# Patient Record
Sex: Female | Born: 1985 | Race: White | Hispanic: No | Marital: Married | Smoking: Former smoker
Health system: Southern US, Community
[De-identification: ages and names within clinical notes are randomized; demographics above are authoritative.]

## PROBLEM LIST (undated history)

## (undated) DIAGNOSIS — G500 Trigeminal neuralgia: Secondary | ICD-10-CM

## (undated) DIAGNOSIS — M26629 Arthralgia of temporomandibular joint, unspecified side: Secondary | ICD-10-CM

## (undated) DIAGNOSIS — F411 Generalized anxiety disorder: Secondary | ICD-10-CM

## (undated) HISTORY — DX: Generalized anxiety disorder: F41.1

## (undated) HISTORY — DX: Arthralgia of temporomandibular joint, unspecified side: M26.629

## (undated) HISTORY — DX: Trigeminal neuralgia: G50.0

---

## 2012-01-05 ENCOUNTER — Other Ambulatory Visit: Payer: Self-pay | Admitting: Physician Assistant

## 2012-01-16 ENCOUNTER — Other Ambulatory Visit: Payer: Self-pay | Admitting: Physician Assistant

## 2012-01-16 LAB — CBC WITH DIFFERENTIAL/PLATELET
Basophil #: 0.1 10*3/uL (ref 0.0–0.1)
Basophil %: 0.6 %
HCT: 42.4 % (ref 35.0–47.0)
MCH: 30.9 pg (ref 26.0–34.0)
MCHC: 34.1 g/dL (ref 32.0–36.0)
Monocyte #: 0.4 x10 3/mm (ref 0.2–0.9)
Platelet: 197 10*3/uL (ref 150–440)
RBC: 4.66 10*6/uL (ref 3.80–5.20)
RDW: 12.5 % (ref 11.5–14.5)

## 2012-01-16 LAB — MONONUCLEOSIS SCREEN: Mono Test: NEGATIVE

## 2012-01-18 LAB — THROAT CULTURE

## 2013-05-20 ENCOUNTER — Emergency Department: Payer: Self-pay | Admitting: Emergency Medicine

## 2014-08-31 ENCOUNTER — Inpatient Hospital Stay: Payer: Self-pay

## 2014-08-31 LAB — CBC WITH DIFFERENTIAL/PLATELET
BASOS PCT: 0.7 %
BASOS PCT: 0.4 %
Basophil #: 0.1 10*3/uL (ref 0.0–0.1)
Basophil #: 0.1 10*3/uL (ref 0.0–0.1)
EOS PCT: 0.7 %
EOS PCT: 0.1 %
Eosinophil #: 0.1 10*3/uL (ref 0.0–0.7)
Eosinophil #: 0 10*3/uL (ref 0.0–0.7)
HCT: 40.3 % (ref 35.0–47.0)
HCT: 30.3 % — AB (ref 35.0–47.0)
HGB: 13.3 g/dL (ref 12.0–16.0)
HGB: 10.2 g/dL — AB (ref 12.0–16.0)
LYMPHS PCT: 20 %
LYMPHS PCT: 11.8 %
Lymphocyte #: 2.4 10*3/uL (ref 1.0–3.6)
Lymphocyte #: 1.9 10*3/uL (ref 1.0–3.6)
MCH: 30.2 pg (ref 26.0–34.0)
MCH: 30.9 pg (ref 26.0–34.0)
MCHC: 33.1 g/dL (ref 32.0–36.0)
MCHC: 33.8 g/dL (ref 32.0–36.0)
MCV: 91 fL (ref 80–100)
MCV: 91 fL (ref 80–100)
MONOS PCT: 5.6 %
MONOS PCT: 6.1 %
Monocyte #: 0.7 x10 3/mm (ref 0.2–0.9)
Monocyte #: 1 x10 3/mm — ABNORMAL HIGH (ref 0.2–0.9)
NEUTROS ABS: 8.8 10*3/uL — AB (ref 1.4–6.5)
NEUTROS ABS: 13.4 10*3/uL — AB (ref 1.4–6.5)
Neutrophil %: 73 %
Neutrophil %: 81.6 %
PLATELETS: 198 10*3/uL (ref 150–440)
Platelet: 183 10*3/uL (ref 150–440)
RBC: 4.42 10*6/uL (ref 3.80–5.20)
RBC: 3.32 10*6/uL — AB (ref 3.80–5.20)
RDW: 14.1 % (ref 11.5–14.5)
RDW: 14.4 % (ref 11.5–14.5)
WBC: 12.1 10*3/uL — ABNORMAL HIGH (ref 3.6–11.0)
WBC: 16.4 10*3/uL — AB (ref 3.6–11.0)

## 2014-09-01 LAB — HEMATOCRIT: HCT: 29.4 % — AB (ref 35.0–47.0)

## 2014-11-19 ENCOUNTER — Encounter: Payer: Self-pay | Admitting: *Deleted

## 2014-12-23 NOTE — H&P (Signed)
L&D Evaluation:  History:  HPI -CC: worsening UCs -HPI: 29 y/o G3P2002 @ 38/2 with the above CC. preg uncomplicated. No LOF or decreased FM.  Was 4cm in the office on 1/14   Medications none   Allergies NKDA   Exam:  Vital Signs af vs normal and stable   General no apparent distress   Abdomen gravid, non-tender   Pelvic c/BBOW/cephalic, [id number]   Mebranes Intact   FHT category I   Ucx regular, q64m   Impression:  Impression active labor   Plan:  Comments patient arom'ed for clear fluid and subsequent delivered. see delivery ntoe gbs neg.   Electronic Signatures: Aletha Halim (MD)  (Signed 17-Jan-16 10:12)  Authored: L&D Evaluation   Last Updated: 17-Jan-16 10:12 by Aletha Halim (MD)

## 2017-01-31 DIAGNOSIS — R079 Chest pain, unspecified: Secondary | ICD-10-CM | POA: Diagnosis not present

## 2017-01-31 DIAGNOSIS — R002 Palpitations: Secondary | ICD-10-CM | POA: Diagnosis not present

## 2017-08-29 DIAGNOSIS — Z0000 Encounter for general adult medical examination without abnormal findings: Secondary | ICD-10-CM | POA: Diagnosis not present

## 2018-04-19 DIAGNOSIS — D2261 Melanocytic nevi of right upper limb, including shoulder: Secondary | ICD-10-CM | POA: Diagnosis not present

## 2018-04-19 DIAGNOSIS — D225 Melanocytic nevi of trunk: Secondary | ICD-10-CM | POA: Diagnosis not present

## 2018-04-19 DIAGNOSIS — D2262 Melanocytic nevi of left upper limb, including shoulder: Secondary | ICD-10-CM | POA: Diagnosis not present

## 2018-07-02 DIAGNOSIS — Z23 Encounter for immunization: Secondary | ICD-10-CM | POA: Diagnosis not present

## 2018-09-02 DIAGNOSIS — J029 Acute pharyngitis, unspecified: Secondary | ICD-10-CM | POA: Diagnosis not present

## 2018-09-28 ENCOUNTER — Ambulatory Visit: Payer: 59 | Admitting: Adult Health

## 2018-09-28 ENCOUNTER — Encounter: Payer: Self-pay | Admitting: Adult Health

## 2018-09-28 VITALS — BP 130/82 | HR 74 | Resp 16 | Ht 61.0 in | Wt 153.0 lb

## 2018-09-28 DIAGNOSIS — F4329 Adjustment disorder with other symptoms: Secondary | ICD-10-CM

## 2018-09-28 DIAGNOSIS — F411 Generalized anxiety disorder: Secondary | ICD-10-CM | POA: Diagnosis not present

## 2018-09-28 MED ORDER — ESCITALOPRAM OXALATE 20 MG PO TABS: 20.0000 mg | ORAL_TABLET | Freq: Every day | ORAL | 1 refills | Status: DC

## 2018-09-28 MED ORDER — ALPRAZOLAM 0.25 MG PO TABS: ORAL_TABLET | ORAL | 0 refills | Status: DC

## 2018-09-28 NOTE — Patient Instructions (Signed)
Living With Anxiety    After being diagnosed with an anxiety disorder, you may be relieved to know why you have felt or behaved a certain way. It is natural to also feel overwhelmed about the treatment ahead and what it will mean for your life. With care and support, you can manage this condition and recover from it.  How to cope with anxiety  Dealing with stress  Stress is your body’s reaction to life changes and events, both good and bad. Stress can last just a few hours or it can be ongoing. Stress can play a major role in anxiety, so it is important to learn both how to cope with stress and how to think about it differently.  Talk with your health care provider or a counselor to learn more about stress reduction. He or she may suggest some stress reduction techniques, such as:  · Music therapy. This can include creating or listening to music that you enjoy and that inspires you.  · Mindfulness-based meditation. This involves being aware of your normal breaths, rather than trying to control your breathing. It can be done while sitting or walking.  · Centering prayer. This is a kind of meditation that involves focusing on a word, phrase, or sacred image that is meaningful to you and that brings you peace.  · Deep breathing. To do this, expand your stomach and inhale slowly through your nose. Hold your breath for 3-5 seconds. Then exhale slowly, allowing your stomach muscles to relax.  · Self-talk. This is a skill where you identify thought patterns that lead to anxiety reactions and correct those thoughts.  · Muscle relaxation. This involves tensing muscles then relaxing them.  Choose a stress reduction technique that fits your lifestyle and personality. Stress reduction techniques take time and practice. Set aside 5-15 minutes a day to do them. Therapists can offer training in these techniques. The training may be covered by some insurance plans. Other things you can do to manage stress include:  · Keeping a  stress diary. This can help you learn what triggers your stress and ways to control your response.  · Thinking about how you respond to certain situations. You may not be able to control everything, but you can control your reaction.  · Making time for activities that help you relax, and not feeling guilty about spending your time in this way.  Therapy combined with coping and stress-reduction skills provides the best chance for successful treatment.  Medicines  Medicines can help ease symptoms. Medicines for anxiety include:  · Anti-anxiety drugs.  · Antidepressants.  · Beta-blockers.  Medicines may be used as the main treatment for anxiety disorder, along with therapy, or if other treatments are not working. Medicines should be prescribed by a health care provider.  Relationships  Relationships can play a big part in helping you recover. Try to spend more time connecting with trusted friends and family members. Consider going to couples counseling, taking family education classes, or going to family therapy. Therapy can help you and others better understand the condition.  How to recognize changes in your condition  Everyone has a different response to treatment for anxiety. Recovery from anxiety happens when symptoms decrease and stop interfering with your daily activities at home or work. This may mean that you will start to:  · Have better concentration and focus.  · Sleep better.  · Be less irritable.  · Have more energy.  · Have improved memory.  It is   important to recognize when your condition is getting worse. Contact your health care provider if your symptoms interfere with home or work and you do not feel like your condition is improving.  Where to find help and support:  You can get help and support from these sources:  · Self-help groups.  · Online and community organizations.  · A trusted spiritual leader.  · Couples counseling.  · Family education classes.  · Family therapy.  Follow these instructions  at home:  · Eat a healthy diet that includes plenty of vegetables, fruits, whole grains, low-fat dairy products, and lean protein. Do not eat a lot of foods that are high in solid fats, added sugars, or salt.  · Exercise. Most adults should do the following:  ? Exercise for at least 150 minutes each week. The exercise should increase your heart rate and make you sweat (moderate-intensity exercise).  ? Strengthening exercises at least twice a week.  · Cut down on caffeine, tobacco, alcohol, and other potentially harmful substances.  · Get the right amount and quality of sleep. Most adults need 7-9 hours of sleep each night.  · Make choices that simplify your life.  · Take over-the-counter and prescription medicines only as told by your health care provider.  · Avoid caffeine, alcohol, and certain over-the-counter cold medicines. These may make you feel worse. Ask your pharmacist which medicines to avoid.  · Keep all follow-up visits as told by your health care provider. This is important.  Questions to ask your health care provider  · Would I benefit from therapy?  · How often should I follow up with a health care provider?  · How long do I need to take medicine?  · Are there any long-term side effects of my medicine?  · Are there any alternatives to taking medicine?  Contact a health care provider if:  · You have a hard time staying focused or finishing daily tasks.  · You spend many hours a day feeling worried about everyday life.  · You become exhausted by worry.  · You start to have headaches, feel tense, or have nausea.  · You urinate more than normal.  · You have diarrhea.  Get help right away if:  · You have a racing heart and shortness of breath.  · You have thoughts of hurting yourself or others.  If you ever feel like you may hurt yourself or others, or have thoughts about taking your own life, get help right away. You can go to your nearest emergency department or call:  · Your local emergency services  (911 in the [place]  · A suicide crisis helpline, such as the National Suicide Prevention Lifeline at 1-800-273-8255. This is open 24-hours a day.  Summary  · Taking steps to deal with stress can help calm you.  · Medicines cannot cure anxiety disorders, but they can help ease symptoms.  · Family, friends, and partners can play a big part in helping you recover from an anxiety disorder.  This information is not intended to replace advice given to you by your health care provider. Make sure you discuss any questions you have with your health care provider.  Document Released: 07/26/2016 Document Revised: 07/26/2016 Document Reviewed: 07/26/2016  Elsevier Interactive Patient Education © 2019 [employer]vier Inc.

## 2018-09-28 NOTE — Progress Notes (Signed)
[facility] [place],  [address]  Internal MEDICINE  Office Visit Note  Patient Name: Jamie Kennedy  [id number]  [id number]  Date of Service: 12/17/2018   Complaints/HPI Pt is here for establishment of PCP. Chief Complaint  Patient presents with  . New Patient (Initial Visit)    also feels something stuck on throat   . Shortness of Breath  . Anxiety   HPI Patient is here to establish care with our practice.  She is moving from [facility].  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  She has 3 kids at home and lives with her husband who also works full-time.  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  She reports that she intermittently smokes maybe 5 cigarettes a day.  She does use a vape also but states that it is not a regular thing.  She has alcohol approximately 3 times a month and denies any illicit drug use.  She currently taking no prescription medications.  She uses Mirena IUD for her birth control that has been in place since 2016.  She mostly complaining of a feeling of something stuck in her throat that she contributes to anxiety and stressful situations.  She describes it as a lump in her throat.  She tried taking Prilosec daily for 2 weeks with no change.  She describe one incident as Working on school work, was Estate agent a paper, was expecting her kid home soon, and she felt super anxious.  She went for walk which makes it better.  Used some essential oils, and started a meditation CD.  She broke out in a cold sweat, shaking, and felt extreme sob. Symptoms are worsening, and happening daily.   Current Medication: Outpatient Encounter Medications as of 09/28/2018  Medication Sig  . escitalopram (LEXAPRO) 20 MG tablet Take 1 tablet (20 mg total) by mouth daily. (Patient not taking: Reported on 12/10/2018)  . [DISCONTINUED] ALPRAZolam (XANAX) 0.25 MG tablet Half to one tab tab as needed for panic attacks   No  facility-administered encounter medications on file as of 09/28/2018.     Surgical History: No surgical history  Medical History: History reviewed. No pertinent past medical history.  Family History: Family History  Problem Relation Age of Onset  . Arthritis/Rheumatoid Mother   . Anxiety disorder Brother   . Parkinson's disease Maternal Grandfather   . Dementia Maternal Grandfather     Social History   Socioeconomic History  . Marital status: Married    Spouse name: Not on file  . Number of children: Not on file  . Years of education: Not on file  . Highest education level: Not on file  Occupational History  . Not on file  Social Needs  . Financial resource strain: Not on file  . Food insecurity:    Worry: Not on file    Inability: Not on file  . Transportation needs:    Medical: Not on file    Non-medical: Not on file  Tobacco Use  . Smoking status: Current Some Day Smoker    Types: Cigarettes  . Smokeless tobacco: Never Used  Substance and Sexual Activity  . Alcohol use: Never    Frequency: Never  . Drug use: Never  . Sexual activity: Not on file  Lifestyle  . Physical activity:    Days per week: Not on file    Minutes per session: Not on file  . Stress: Not on file  Relationships  .  Social connections:    Talks on phone: Not on file    Gets together: Not on file    Attends religious service: Not on file    Active member of club or organization: Not on file    Attends meetings of clubs or organizations: Not on file    Relationship status: Not on file  . Intimate partner violence:    Fear of current or ex partner: Not on file    Emotionally abused: Not on file    Physically abused: Not on file    Forced sexual activity: Not on file  Other Topics Concern  . Not on file  Social History Narrative  . Not on file     Review of Systems  Constitutional: Negative for chills, fatigue and unexpected weight change.  HENT: Negative for congestion,  rhinorrhea, sneezing and sore throat.   Eyes: Negative for photophobia, pain and redness.  Respiratory: Negative for cough, chest tightness and shortness of breath.   Cardiovascular: Negative for chest pain and palpitations.  Gastrointestinal: Negative for abdominal pain, constipation, diarrhea, nausea and vomiting.  Endocrine: Negative.   Genitourinary: Negative for dysuria and frequency.  Musculoskeletal: Negative for arthralgias, back pain, joint swelling and neck pain.  Skin: Negative for rash.  Allergic/Immunologic: Negative.   Neurological: Negative for tremors and numbness.  Hematological: Negative for adenopathy. Does not bruise/bleed easily.  Psychiatric/Behavioral: Negative for behavioral problems and sleep disturbance. The patient is not nervous/anxious.     Vital Signs: BP 130/82   Pulse 74   Resp 16   Ht 5\' 1"  (1.549 m)   Wt 153 lb (69.4 kg)   SpO2 98%   BMI 28.91 kg/m    Physical Exam Vitals signs and nursing note reviewed.  Constitutional:      General: She is not in acute distress.    Appearance: She is well-developed. She is not diaphoretic.  HENT:     Head: Normocephalic and atraumatic.     Mouth/Throat:     Pharynx: No oropharyngeal exudate.  Eyes:     Pupils: Pupils are equal, round, and reactive to light.  Neck:     Musculoskeletal: Normal range of motion and neck supple.     Thyroid: No thyromegaly.     Vascular: No JVD.     Trachea: No tracheal deviation.  Cardiovascular:     Rate and Rhythm: Normal rate and regular rhythm.     Heart sounds: Normal heart sounds. No murmur. No friction rub. No gallop.   Pulmonary:     Effort: Pulmonary effort is normal. No respiratory distress.     Breath sounds: Normal breath sounds. No wheezing or rales.  Chest:     Chest wall: No tenderness.  Abdominal:     Palpations: Abdomen is soft.     Tenderness: There is no abdominal tenderness. There is no guarding.  Musculoskeletal: Normal range of motion.   Lymphadenopathy:     Cervical: No cervical adenopathy.  Skin:    General: Skin is warm and dry.  Neurological:     Mental Status: She is alert and oriented to person, place, and time.     Cranial Nerves: No cranial nerve deficit.  Psychiatric:        Behavior: Behavior normal.        Thought Content: Thought content normal.        Judgment: Judgment normal.    Assessment/Plan: 1. GAD (generalized anxiety disorder) Pts symptoms likely due to the culmination of her life  currently.  Her stress and anxiety are high.  Will presscribe some xanax for as needed and give her an RX for lexapro to start if she starts to need the xanax often.  She feels like she can control it most of the time, however, feels that the xanax could be helpful at times.  - escitalopram (LEXAPRO) 20 MG tablet; Take 1 tablet (20 mg total) by mouth daily. (Patient not taking: Reported on 12/10/2018)  Dispense: 30 tablet; Refill: 1  2. Stress and adjustment reaction Discussed ways to continue to modify stressors. Continue to use meditation, and exercise as discussed.   General Counseling: Sherral verbalizes understanding of the findings of todays visit and agrees with plan of treatment. I have discussed any further diagnostic evaluation that may be needed or ordered today. We also reviewed her medications today. she has been encouraged to call the office with any questions or concerns that should arise related to todays visit.  No orders of the defined types were placed in this encounter.   Meds ordered this encounter  Medications  . escitalopram (LEXAPRO) 20 MG tablet    Sig: Take 1 tablet (20 mg total) by mouth daily.    Dispense:  30 tablet    Refill:  1  . DISCONTD: ALPRAZolam (XANAX) 0.25 MG tablet    Sig: Half to one tab tab as needed for panic attacks    Dispense:  10 tablet    Refill:  0    Time spent: 25 Minutes   This patient was seen by Orson Gear AGNP-C in Collaboration with Dr Lavera Guise as a  part of collaborative care agreement  Kendell Bane AGNP-C Internal Medicine

## 2018-10-01 ENCOUNTER — Encounter: Payer: Self-pay | Admitting: Adult Health

## 2018-10-01 ENCOUNTER — Ambulatory Visit: Payer: 59 | Admitting: Adult Health

## 2018-10-01 VITALS — BP 118/82 | HR 79 | Temp 98.6°F | Resp 16 | Ht 61.0 in | Wt 153.0 lb

## 2018-10-01 DIAGNOSIS — J0110 Acute frontal sinusitis, unspecified: Secondary | ICD-10-CM | POA: Diagnosis not present

## 2018-10-01 DIAGNOSIS — J029 Acute pharyngitis, unspecified: Secondary | ICD-10-CM | POA: Diagnosis not present

## 2018-10-01 DIAGNOSIS — R011 Cardiac murmur, unspecified: Secondary | ICD-10-CM

## 2018-10-01 LAB — POCT RAPID STREP A (OFFICE): Rapid Strep A Screen: NEGATIVE

## 2018-10-01 MED ORDER — AMOXICILLIN-POT CLAVULANATE 875-125 MG PO TABS: 1.0000 | ORAL_TABLET | Freq: Two times a day (BID) | ORAL | 0 refills | Status: DC

## 2018-10-01 NOTE — Patient Instructions (Signed)

## 2018-10-01 NOTE — Progress Notes (Signed)
[facility] [place], [place] [address]  Internal MEDICINE  Office Visit Note  Patient Name: Jamie Kennedy  [id number]  [id number]  Date of Service: 10/03/2018  Chief Complaint  Patient presents with  . Sore Throat    three other members of family had strep   . Ear Pain    both      HPI Pt is here for a sick visit. Pt reports sore throat, ear fullness and PND.  She reports facial pain and tenderness but denies any fever.  However, she does report that she has felt chills over the last few days.  She is concerned that she may have the beginnings of strep throat as the other members living in her household have been diagnosed with strep.  She is also reporting some ear fullness that is intermittent.     Current Medication:  Outpatient Encounter Medications as of 10/01/2018  Medication Sig  . ALPRAZolam (XANAX) 0.25 MG tablet Half to one tab tab as needed for panic attacks  . escitalopram (LEXAPRO) 20 MG tablet Take 1 tablet (20 mg total) by mouth daily.  Marland Kitchen amoxicillin-clavulanate (AUGMENTIN) 875-125 MG tablet Take 1 tablet by mouth 2 (two) times daily.   No facility-administered encounter medications on file as of 10/01/2018.       Medical History: History reviewed. No pertinent past medical history.   Vital Signs: BP 118/82   Pulse 79   Temp 98.6 F (37 C)   Resp 16   Ht 5\' 1"  (1.549 m)   Wt 153 lb (69.4 kg)   SpO2 99%   BMI 28.91 kg/m    Review of Systems  Constitutional: Negative for chills, fatigue and unexpected weight change.  HENT: Positive for ear pain, postnasal drip, rhinorrhea, sinus pressure and sinus pain. Negative for congestion, sneezing and sore throat.   Eyes: Negative for photophobia, pain and redness.  Respiratory: Negative for cough, chest tightness and shortness of breath.   Cardiovascular: Negative for chest pain and palpitations.  Gastrointestinal: Negative for abdominal pain, constipation, diarrhea, nausea and  vomiting.  Endocrine: Negative.   Genitourinary: Negative for dysuria and frequency.  Musculoskeletal: Negative for arthralgias, back pain, joint swelling and neck pain.  Skin: Negative for rash.  Allergic/Immunologic: Negative.   Neurological: Negative for tremors and numbness.  Hematological: Negative for adenopathy. Does not bruise/bleed easily.  Psychiatric/Behavioral: Negative for behavioral problems and sleep disturbance. The patient is not nervous/anxious.     Physical Exam Vitals signs and nursing note reviewed.  Constitutional:      General: She is not in acute distress.    Appearance: She is well-developed. She is not diaphoretic.  HENT:     Head: Normocephalic and atraumatic.     Mouth/Throat:     Pharynx: No oropharyngeal exudate.  Eyes:     Pupils: Pupils are equal, round, and reactive to light.  Neck:     Musculoskeletal: Normal range of motion and neck supple.     Thyroid: No thyromegaly.     Vascular: No JVD.     Trachea: No tracheal deviation.  Cardiovascular:     Rate and Rhythm: Normal rate and regular rhythm.     Heart sounds: Murmur present. No friction rub. No gallop.   Pulmonary:     Effort: Pulmonary effort is normal. No respiratory distress.     Breath sounds: Normal breath sounds. No wheezing or rales.  Chest:     Chest wall: No tenderness.  Abdominal:  Palpations: Abdomen is soft.     Tenderness: There is no abdominal tenderness. There is no guarding.  Musculoskeletal: Normal range of motion.  Lymphadenopathy:     Cervical: No cervical adenopathy.  Skin:    General: Skin is warm and dry.  Neurological:     Mental Status: She is alert and oriented to person, place, and time.     Cranial Nerves: No cranial nerve deficit.  Psychiatric:        Behavior: Behavior normal.        Thought Content: Thought content normal.        Judgment: Judgment normal.    Assessment/Plan: 1. Acute non-recurrent frontal sinusitis Patient provided with  course of Augmentin.  Instructed patient to take all medication until completed.  Patient may return to clinic in 7 to 10 days if symptoms fail to improve.  I encouraged her to continue take over-the-counter medications to treat her symptoms and to drink plenty of fluids and rest as much as possible. - amoxicillin-clavulanate (AUGMENTIN) 875-125 MG tablet; Take 1 tablet by mouth 2 (two) times daily.  Dispense: 20 tablet; Refill: 0  2. Sore throat Strep negative today in the office. - POCT rapid strep A  3. Murmur Heart murmur noted on exam will get echo to full evaluate. - ECHOCARDIOGRAM COMPLETE; Future  General Counseling: Jamie Kennedy verbalizes understanding of the findings of todays visit and agrees with plan of treatment. I have discussed any further diagnostic evaluation that may be needed or ordered today. We also reviewed her medications today. she has been encouraged to call the office with any questions or concerns that should arise related to todays visit.   Orders Placed This Encounter  Procedures  . POCT rapid strep A  . ECHOCARDIOGRAM COMPLETE    Meds ordered this encounter  Medications  . amoxicillin-clavulanate (AUGMENTIN) 875-125 MG tablet    Sig: Take 1 tablet by mouth 2 (two) times daily.    Dispense:  20 tablet    Refill:  0    Time spent: 25 Minutes  This patient was seen by Orson Gear AGNP-C in Collaboration with Dr Lavera Guise as a part of collaborative care agreement.  Kendell Bane AGNP-C Internal Medicine

## 2018-10-05 ENCOUNTER — Other Ambulatory Visit: Payer: Self-pay

## 2018-10-12 ENCOUNTER — Ambulatory Visit: Payer: 59

## 2018-10-12 DIAGNOSIS — R011 Cardiac murmur, unspecified: Secondary | ICD-10-CM

## 2018-10-16 ENCOUNTER — Encounter: Payer: Self-pay | Admitting: Adult Health

## 2018-11-13 ENCOUNTER — Other Ambulatory Visit: Payer: Self-pay | Admitting: Adult Health

## 2018-12-10 ENCOUNTER — Other Ambulatory Visit: Payer: Self-pay | Admitting: Adult Health

## 2018-12-10 ENCOUNTER — Other Ambulatory Visit: Payer: Self-pay

## 2018-12-10 ENCOUNTER — Encounter: Payer: Self-pay | Admitting: Adult Health

## 2018-12-10 ENCOUNTER — Ambulatory Visit (INDEPENDENT_AMBULATORY_CARE_PROVIDER_SITE_OTHER): Payer: 59 | Admitting: Adult Health

## 2018-12-10 VITALS — Resp 16 | Ht 61.0 in | Wt 152.0 lb

## 2018-12-10 DIAGNOSIS — R011 Cardiac murmur, unspecified: Secondary | ICD-10-CM

## 2018-12-10 DIAGNOSIS — F411 Generalized anxiety disorder: Secondary | ICD-10-CM | POA: Diagnosis not present

## 2018-12-10 MED ORDER — ALPRAZOLAM 0.25 MG PO TABS: ORAL_TABLET | ORAL | 0 refills | Status: DC

## 2018-12-10 NOTE — Patient Instructions (Signed)
Alprazolam tablets What is this medicine? ALPRAZOLAM (al PRAY zoe lam) is a benzodiazepine. It is used to treat anxiety and panic attacks. This medicine may be used for other purposes; ask your health care provider or pharmacist if you have questions. COMMON BRAND NAME(S): Xanax What should I tell my health care provider before I take this medicine? They need to know if you have any of these conditions: -an alcohol or drug abuse problem -bipolar disorder, depression, psychosis or other mental health conditions -glaucoma -kidney or liver disease -lung or breathing disease -myasthenia gravis -Parkinson's disease -porphyria -seizures or a history of seizures -suicidal thoughts -an unusual or allergic reaction to alprazolam, other benzodiazepines, foods, dyes, or preservatives -pregnant or trying to get pregnant -breast-feeding How should I use this medicine? Take this medicine by mouth with a glass of water. Follow the directions on the prescription label. Take your medicine at regular intervals. Do not take it more often than directed. Do not stop taking except on your doctor's advice. A special MedGuide will be given to you by the pharmacist with each prescription and refill. Be sure to read this information carefully each time. Talk to your pediatrician regarding the use of this medicine in children. Special care may be needed. Overdosage: If you think you have taken too much of this medicine contact a poison control center or emergency room at once. NOTE: This medicine is only for you. Do not share this medicine with others. What if I miss a dose? If you miss a dose, take it as soon as you can. If it is almost time for your next dose, take only that dose. Do not take double or extra doses. What may interact with this medicine? Do not take this medicine with any of the following medications: -certain antiviral medicines for HIV or AIDS like delavirdine, indinavir -certain medicines for  fungal infections like ketoconazole and itraconazole -narcotic medicines for cough -sodium oxybate This medicine may also interact with the following medications: -alcohol -antihistamines for allergy, cough and cold -certain antibiotics like clarithromycin, erythromycin, isoniazid, rifampin, rifapentine, rifabutin, and troleandomycin -certain medicines for blood pressure, heart disease, irregular heart beat -certain medicines for depression, like amitriptyline, fluoxetine, sertraline -certain medicines for seizures like carbamazepine, oxcarbazepine, phenobarbital, phenytoin, primidone -cimetidine -cyclosporine -female hormones, like estrogens or progestins and birth control pills, patches, rings, or injections -general anesthetics like halothane, isoflurane, methoxyflurane, propofol -grapefruit juice -local anesthetics like lidocaine, pramoxine, tetracaine -medicines that relax muscles for surgery -narcotic medicines for pain -other antiviral medicines for HIV or AIDS -phenothiazines like chlorpromazine, mesoridazine, prochlorperazine, thioridazine This list may not describe all possible interactions. Give your health care provider a list of all the medicines, herbs, non-prescription drugs, or dietary supplements you use. Also tell them if you smoke, drink alcohol, or use illegal drugs. Some items may interact with your medicine. What should I watch for while using this medicine? Tell your doctor or health care professional if your symptoms do not start to get better or if they get worse. Do not stop taking except on your doctor's advice. You may develop a severe reaction. Your doctor will tell you how much medicine to take. You may get drowsy or dizzy. Do not drive, use machinery, or do anything that needs mental alertness until you know how this medicine affects you. To reduce the risk of dizzy and fainting spells, do not stand or sit up quickly, especially if you are an older patient.  Alcohol may increase dizziness and drowsiness. Avoid alcoholic  drowsy or dizzy. Do not drive, use machinery, or do anything that needs mental alertness until you know how this medicine affects you. To reduce the risk of dizzy and fainting spells, do not stand or sit up quickly, especially if you are an older patient.  Alcohol may increase dizziness and drowsiness. Avoid alcoholic drinks.  If you are taking another medicine that also causes drowsiness, you may have more side effects. Give your health care provider a list of all medicines you use. Your doctor will tell you how much medicine to take. Do not take more medicine than directed. Call emergency for help if you have problems breathing or unusual sleepiness.  What side effects may I notice from receiving this medicine?  Side effects that you should report to your doctor or health care professional as soon as possible:  -allergic reactions like skin rash, itching or hives, swelling of the face, lips, or tongue  -breathing problems  -confusion  -loss of balance or coordination  -signs and symptoms of low blood pressure like dizziness; feeling faint or lightheaded, falls; unusually weak or tired  -suicidal thoughts or other mood changes  Side effects that usually do not require medical attention (report to your doctor or health care professional if they continue or are bothersome):  -dizziness  -dry mouth  -nausea, vomiting  -tiredness  This list may not describe all possible side effects. Call your doctor for medical advice about side effects. You may report side effects to FDA at 1-800-FDA-1088.  Where should I keep my medicine?  Keep out of the reach of children. This medicine can be abused. Keep your medicine in a safe place to protect it from theft. Do not share this medicine with anyone. Selling or giving away this medicine is dangerous and against the law.  Store at room temperature between 20 and 25 degrees C (68 and 77 degrees F). This medicine may cause accidental overdose and death if taken by other adults, children, or pets. Mix any unused medicine with a substance like cat litter or coffee grounds. Then throw the medicine away in a sealed container like a sealed bag or a coffee can with a lid. Do not use the medicine after the expiration date.  NOTE: This sheet is  a summary. It may not cover all possible information. If you have questions about this medicine, talk to your doctor, pharmacist, or health care provider.  © 2019 Elsevier/Gold Standard (2015-04-30 13:47:25)

## 2018-12-10 NOTE — Progress Notes (Signed)
[facility] [place], [place] [address]  Internal MEDICINE  Telephone Visit  Patient Name: Jamie Kennedy  [id number]  [id number]  Date of Service: 12/10/2018  I connected with the patient at 1010 by telephone and verified the patients identity using two identifiers. I discussed the limitations, risks, security and privacy concerns of performing an evaluation and management service by telephone and the availability of in person appointments. I also discussed with the patient that there may be a patient responsible charge related to the service.  The patient expressed understanding and agrees to proceed.    Chief Complaint  Patient presents with  . Telephone Screen  . Medical Management of Chronic Issues    ECHO REVIEW   . Telephone Assessment    HPI  PT reports feeling better since last visit.  She reports she did not start the lexapro because her symptoms improved greatly.  This is very good considering the recent Covid-19 lockdown and her children being out of school.  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  She has managed well, and has only taken 6 or the 10 Xanax tablets as of today.  She reports good control of symptoms, and she would like a refill to have xanax on hand for future events.  Her Echo results are reviewed with her at this time. Normal LVEF, and trace TR.      Current Medication: Outpatient Encounter Medications as of 12/10/2018  Medication Sig  . ALPRAZolam (XANAX) 0.25 MG tablet Half to one tab tab as needed for panic attacks  . [DISCONTINUED] ALPRAZolam (XANAX) 0.25 MG tablet Half to one tab tab as needed for panic attacks  . escitalopram (LEXAPRO) 20 MG tablet Take 1 tablet (20 mg total) by mouth daily. (Patient not taking: Reported on 12/10/2018)  . [DISCONTINUED] amoxicillin-clavulanate (AUGMENTIN) 875-125 MG tablet Take 1 tablet by mouth 2 (two) times daily.   No  facility-administered encounter medications on file as of 12/10/2018.     Surgical History: No past surgical history on file.  Medical History: No past medical history on file.  Family History: Family History  Problem Relation Age of Onset  . Arthritis/Rheumatoid Mother   . Anxiety disorder Brother   . Parkinson's disease Maternal Grandfather   . Dementia Maternal Grandfather     Social History   Socioeconomic History  . Marital status: Married    Spouse name: Not on file  . Number of children: Not on file  . Years of education: Not on file  . Highest education level: Not on file  Occupational History  . Not on file  Social Needs  . Financial resource strain: Not on file  . Food insecurity:    Worry: Not on file    Inability: Not on file  . Transportation needs:    Medical: Not on file    Non-medical: Not on file  Tobacco Use  . Smoking status: Current Some Day Smoker    Types: Cigarettes  . Smokeless tobacco: Never Used  Substance and Sexual Activity  . Alcohol use: Never    Frequency: Never  . Drug use: Never  . Sexual activity: Not on file  Lifestyle  . Physical activity:    Days per week: Not on file    Minutes per session: Not on file  . Stress: Not on file  Relationships  . Social connections:    Talks on phone: Not  on file    Gets together: Not on file    Attends religious service: Not on file    Active member of club or organization: Not on file    Attends meetings of clubs or organizations: Not on file    Relationship status: Not on file  . Intimate partner violence:    Fear of current or ex partner: Not on file    Emotionally abused: Not on file    Physically abused: Not on file    Forced sexual activity: Not on file  Other Topics Concern  . Not on file  Social History Narrative  . Not on file      Review of Systems  Constitutional: Negative for chills, fatigue and unexpected weight change.  HENT: Negative for congestion, rhinorrhea,  sneezing and sore throat.   Eyes: Negative for photophobia, pain and redness.  Respiratory: Negative for cough, chest tightness and shortness of breath.   Cardiovascular: Negative for chest pain and palpitations.  Gastrointestinal: Negative for abdominal pain, constipation, diarrhea, nausea and vomiting.  Endocrine: Negative.   Genitourinary: Negative for dysuria and frequency.  Musculoskeletal: Negative for arthralgias, back pain, joint swelling and neck pain.  Skin: Negative for rash.  Allergic/Immunologic: Negative.   Neurological: Negative for tremors and numbness.  Hematological: Negative for adenopathy. Does not bruise/bleed easily.  Psychiatric/Behavioral: Negative for behavioral problems and sleep disturbance. The patient is not nervous/anxious.     Vital Signs: Resp 16   Ht 5\' 1"  (1.549 m)   Wt 152 lb (68.9 kg)   BMI 28.72 kg/m    Observation/Objective:  Appears well, speaking in full sentences.    Assessment/Plan: 1. GAD (generalized anxiety disorder) Pt reports taking approximatly 6 tablets of the 10 ordered previously.  She states she has good contorl of her anxiety but feels like she will need to have the xanax on hand for future issues intermittnelty.  Refilled patients RX, and will see her back for physical in one month as scheduled.  Reviewed risks and possible side effects associated with taking opiates, benzodiazepines and other CNS depressants. Combination of these could cause dizziness and drowsiness. Advised patient not to drive or operate machinery when taking these medications, as patient's and other's life can be at risk and will have consequences. Patient verbalized understanding in this matter. Dependence and abuse for these drugs will be monitored closely. A Controlled substance policy and procedure is on file which allows [facility] to order a urine drug screen test at any visit. Patient understands and agrees with the plan - ALPRAZolam (XANAX)  0.25 MG tablet; Half to one tab tab as needed for panic attacks  Dispense: 30 tablet; Refill: 0  2. Murmur Trace Tricuspid regurgitation on echo.  General Counseling: Jamie Kennedy verbalizes understanding of the findings of today's phone visit and agrees with plan of treatment. I have discussed any further diagnostic evaluation that may be needed or ordered today. We also reviewed her medications today. she has been encouraged to call the office with any questions or concerns that should arise related to todays visit.    No orders of the defined types were placed in this encounter.   Meds ordered this encounter  Medications  . ALPRAZolam (XANAX) 0.25 MG tablet    Sig: Half to one tab tab as needed for panic attacks    Dispense:  30 tablet    Refill:  0    Time spent: [place] AGNP-C Internal medicine

## 2019-01-14 ENCOUNTER — Other Ambulatory Visit: Payer: Self-pay

## 2019-01-14 ENCOUNTER — Ambulatory Visit (INDEPENDENT_AMBULATORY_CARE_PROVIDER_SITE_OTHER): Payer: 59 | Admitting: Nurse Practitioner

## 2019-01-14 ENCOUNTER — Encounter: Payer: Self-pay | Admitting: Nurse Practitioner

## 2019-01-14 VITALS — BP 112/70 | HR 76 | Resp 16 | Ht 61.0 in | Wt 155.2 lb

## 2019-01-14 DIAGNOSIS — F411 Generalized anxiety disorder: Secondary | ICD-10-CM

## 2019-01-14 DIAGNOSIS — Z0001 Encounter for general adult medical examination with abnormal findings: Secondary | ICD-10-CM

## 2019-01-14 DIAGNOSIS — Z124 Encounter for screening for malignant neoplasm of cervix: Secondary | ICD-10-CM

## 2019-01-14 DIAGNOSIS — R300 Dysuria: Secondary | ICD-10-CM | POA: Diagnosis not present

## 2019-01-14 LAB — HM PAP SMEAR: HM Pap smear: NEGATIVE

## 2019-01-14 NOTE — Progress Notes (Signed)
[facility] [place], [place] [address]  Internal MEDICINE  Office Visit Note  Patient Name: Jamie Kennedy  [id number]  [id number]  Date of Service: 01/22/2019   Pt is here for routine health maintenance examination   Chief Complaint  Patient presents with  . Annual Exam  . Gynecologic Exam     The patient is here for health maintenance exam. She has no concerns or complaints to discuss. Has irregular periods as she has Mirena IUD. Will be due for removal next year. The patient had been prescribed lexapro and low dose alprazolam back in February. Had been been having panic attacks and palpitations. She found that taking decongestants was causing the problems. Never had to take lexapro. Will, very intermittently, take alprazolam for bad anxiety, especially related to travel.    Current Medication: Outpatient Encounter Medications as of 01/14/2019  Medication Sig  . ALPRAZolam (XANAX) 0.25 MG tablet Half to one tab tab as needed for panic attacks  . [DISCONTINUED] escitalopram (LEXAPRO) 20 MG tablet Take 1 tablet (20 mg total) by mouth daily. (Patient not taking: Reported on 12/10/2018)   No facility-administered encounter medications on file as of 01/14/2019.     Surgical History: History reviewed. No pertinent surgical history.  Medical History: History reviewed. No pertinent past medical history.  Family History: Family History  Problem Relation Age of Onset  . Arthritis/Rheumatoid Mother   . Anxiety disorder Brother   . Parkinson's disease Maternal Grandfather   . Dementia Maternal Grandfather       Review of Systems  Constitutional: Negative for chills, fatigue and unexpected weight change.  HENT: Negative for congestion, rhinorrhea, sneezing and sore throat.   Respiratory: Negative for cough, chest tightness and shortness of breath.   Cardiovascular: Negative for chest pain and palpitations.  Gastrointestinal: Negative for abdominal pain,  constipation, diarrhea, nausea and vomiting.  Endocrine: Negative for cold intolerance, heat intolerance, polydipsia and polyuria.  Genitourinary: Negative for dysuria, frequency and vaginal discharge.  Musculoskeletal: Negative for arthralgias, back pain, joint swelling and neck pain.  Skin: Negative for rash.  Allergic/Immunologic: Negative for environmental allergies.  Neurological: Negative for dizziness, tremors, numbness and headaches.  Hematological: Negative for adenopathy. Does not bruise/bleed easily.  Psychiatric/Behavioral: Negative for behavioral problems and sleep disturbance. The patient is not nervous/anxious.      Today's Vitals   01/14/19 1445  BP: 112/70  Pulse: 76  Resp: 16  SpO2: 100%  Weight: 155 lb 3.2 oz (70.4 kg)  Height: 5\' 1"  (1.549 m)   Body mass index is 29.32 kg/m.   Physical Exam Vitals signs and nursing note reviewed.  Constitutional:      General: She is not in acute distress.    Appearance: Normal appearance. She is well-developed. She is not diaphoretic.  HENT:     Head: Normocephalic and atraumatic.     Mouth/Throat:     Pharynx: No oropharyngeal exudate.  Eyes:     Pupils: Pupils are equal, round, and reactive to light.  Neck:     Musculoskeletal: Normal range of motion and neck supple.     Thyroid: No thyromegaly.     Vascular: No JVD.     Trachea: No tracheal deviation.  Cardiovascular:     Rate and Rhythm: Normal rate and regular rhythm.     Pulses: Normal pulses.     Heart sounds: Normal heart sounds. No friction rub. No gallop.   Pulmonary:     Effort: Pulmonary effort is  normal. No respiratory distress.     Breath sounds: Normal breath sounds. No wheezing or rales.  Chest:     Chest wall: No tenderness.     Breasts:        Right: Normal. No swelling, bleeding, inverted nipple, mass, nipple discharge, skin change or tenderness.        Left: Normal. No swelling, bleeding, inverted nipple, mass, nipple discharge, skin change  or tenderness.  Abdominal:     General: Abdomen is flat. Bowel sounds are normal.     Palpations: Abdomen is soft.     Tenderness: There is no abdominal tenderness. There is no guarding.     Hernia: There is no hernia in the right inguinal area or left inguinal area.  Genitourinary:    General: Normal vulva.     Exam position: Supine.     Labia:        Right: No tenderness or lesion.        Left: No tenderness or lesion.      Vagina: Normal. No vaginal discharge, erythema, tenderness or bleeding.     Cervix: No cervical motion tenderness, discharge or erythema.     Uterus: Normal.      Adnexa: Right adnexa normal and left adnexa normal.     Comments: No tenderness, masses, or organomeglay present during bimanual exam . Musculoskeletal: Normal range of motion.  Lymphadenopathy:     Cervical: No cervical adenopathy.     Lower Body: No right inguinal adenopathy. No left inguinal adenopathy.  Skin:    General: Skin is warm and dry.  Neurological:     Mental Status: She is alert and oriented to person, place, and time.     Cranial Nerves: No cranial nerve deficit.  Psychiatric:        Behavior: Behavior normal.        Thought Content: Thought content normal.        Judgment: Judgment normal.      LABS: Recent Results (from the past 2160 hour(s))  UA/M w/rflx Culture, Routine     Status: None   Collection Time: 01/14/19  2:55 PM  Result Value Ref Range   Specific Gravity, UA 1.005 1.005 - 1.030   pH, UA 7.0 5.0 - 7.5   Color, UA Yellow Yellow   Appearance Ur Clear Clear   Leukocytes,UA Negative Negative   Protein,UA Negative Negative/Trace   Glucose, UA Negative Negative   Ketones, UA Negative Negative   RBC, UA Negative Negative   Bilirubin, UA Negative Negative   Urobilinogen, Ur 0.2 0.2 - 1.0 mg/dL   Nitrite, UA Negative Negative   Microscopic Examination Comment     Comment: Microscopic follows if indicated.   Microscopic Examination See below:     Comment:  Microscopic was indicated and was performed.   Urinalysis Reflex Comment     Comment: This specimen will not reflex to a Urine Culture.  Microscopic Examination     Status: None   Collection Time: 01/14/19  2:55 PM  Result Value Ref Range   WBC, UA 0-5 0 - 5 /hpf   RBC 0-2 0 - 2 /hpf   Epithelial Cells (non renal) 0-10 0 - 10 /hpf   Casts None seen None seen /lpf   Mucus, UA Present Not Estab.   Bacteria, UA None seen None seen/Few  CBC with Differential/Platelet     Status: None   Collection Time: 01/17/19  8:28 AM  Result Value Ref Range   WBC  10.0 3.4 - 10.8 x10E3/uL   RBC 4.54 3.77 - 5.28 x10E6/uL   Hemoglobin 14.1 11.1 - 15.9 g/dL   Hematocrit 40.6 34.0 - 46.6 %   MCV 89 79 - 97 fL   MCH 31.1 26.6 - 33.0 pg   MCHC 34.7 31.5 - 35.7 g/dL   RDW 11.7 11.7 - 15.4 %   Platelets 232 150 - 450 x10E3/uL   Neutrophils 60 Not Estab. %   Lymphs 30 Not Estab. %   Monocytes 7 Not Estab. %   Eos 2 Not Estab. %   Basos 1 Not Estab. %   Neutrophils Absolute 6.0 1.4 - 7.0 x10E3/uL   Lymphocytes Absolute 3.0 0.7 - 3.1 x10E3/uL   Monocytes Absolute 0.7 0.1 - 0.9 x10E3/uL   EOS (ABSOLUTE) 0.2 0.0 - 0.4 x10E3/uL   Basophils Absolute 0.1 0.0 - 0.2 x10E3/uL   Immature Granulocytes 0 Not Estab. %   Immature Grans (Abs) 0.0 0.0 - 0.1 x10E3/uL  Comprehensive metabolic panel     Status: None   Collection Time: 01/17/19  8:28 AM  Result Value Ref Range   Glucose 93 65 - 99 mg/dL   BUN 10 6 - 20 mg/dL   Creatinine, Ser 0.75 0.57 - 1.00 mg/dL   GFR calc non Af Amer 105 >59 mL/min/1.73   GFR calc Af Amer 121 >59 mL/min/1.73   BUN/Creatinine Ratio 13 9 - 23   Sodium 138 134 - 144 mmol/L   Potassium 4.2 3.5 - 5.2 mmol/L   Chloride 104 96 - 106 mmol/L   CO2 20 20 - 29 mmol/L   Calcium 9.0 8.7 - 10.2 mg/dL   Total Protein 6.2 6.0 - 8.5 g/dL   Albumin 4.2 3.8 - 4.8 g/dL   Globulin, Total 2.0 1.5 - 4.5 g/dL   Albumin/Globulin Ratio 2.1 1.2 - 2.2   Bilirubin Total 0.4 0.0 - 1.2 mg/dL   Alkaline  Phosphatase 56 39 - 117 IU/L   AST 13 0 - 40 IU/L   ALT 19 0 - 32 IU/L  Lipid Panel w/o Chol/HDL Ratio     Status: None   Collection Time: 01/17/19  8:28 AM  Result Value Ref Range   Cholesterol, Total 140 100 - 199 mg/dL   Triglycerides 66 0 - 149 mg/dL   HDL 43 >39 mg/dL   VLDL Cholesterol Cal 13 5 - 40 mg/dL   LDL Calculated 84 0 - 99 mg/dL  T4, free     Status: None   Collection Time: 01/17/19  8:28 AM  Result Value Ref Range   Free T4 1.12 0.82 - 1.77 ng/dL  TSH     Status: None   Collection Time: 01/17/19  8:28 AM  Result Value Ref Range   TSH 1.800 0.450 - 4.500 uIU/mL  VITAMIN D 25 Hydroxy (Vit-D Deficiency, Fractures)     Status: Abnormal   Collection Time: 01/17/19  8:28 AM  Result Value Ref Range   Vit D, 25-Hydroxy 26.5 (L) 30.0 - 100.0 ng/mL    Comment: Vitamin D deficiency has been defined by the Institute of Medicine and an Endocrine Society practice guideline as a level of serum 25-OH vitamin D less than 20 ng/mL (1,2). The Endocrine Society went on to further define vitamin D insufficiency as a level between 21 and 29 ng/mL (2). 1. IOM (Institute of Medicine). 2010. Dietary reference    intakes for calcium and D. [place]: The    [employer]. 2. Holick MF, Binkley Advance,  Bischoff-Ferrari HA, et al.    Evaluation, treatment, and prevention of vitamin D    deficiency: an Endocrine Society clinical practice    guideline. JCEM. 2011 Jul; 96(7):1911-30.    Assessment/Plan: 1. Encounter for general adult medical examination with abnormal findings Annual health maintenance exam today  2. GAD (generalized anxiety disorder) Doing well. Not taking lexapro and very rarely taking alprazolam. Will continue to monitor closely.   3. Screening for malignant neoplasm of cervix - Pap IG and HPV (high risk) DNA detection  4. Dysuria - UA/M w/rflx Culture, Routine  General Counseling: Elianna verbalizes understanding of the findings of todays visit and  agrees with plan of treatment. I have discussed any further diagnostic evaluation that may be needed or ordered today. We also reviewed her medications today. she has been encouraged to call the office with any questions or concerns that should arise related to todays visit.    Counseling:  This patient was seen by Leretha Pol FNP Collaboration with Dr Lavera Guise as a part of collaborative care agreement  Orders Placed This Encounter  Procedures  . Microscopic Examination  . UA/M w/rflx Culture, Routine      Time spent: [place], MD  Internal Medicine

## 2019-01-15 LAB — UA/M W/RFLX CULTURE, ROUTINE
Bilirubin, UA: NEGATIVE
Glucose, UA: NEGATIVE
Ketones, UA: NEGATIVE
Leukocytes,UA: NEGATIVE
Nitrite, UA: NEGATIVE
Protein,UA: NEGATIVE
RBC, UA: NEGATIVE
Specific Gravity, UA: 1.005 (ref 1.005–1.030)
Urobilinogen, Ur: 0.2 mg/dL (ref 0.2–1.0)
pH, UA: 7 (ref 5.0–7.5)

## 2019-01-15 LAB — MICROSCOPIC EXAMINATION
Bacteria, UA: NONE SEEN
Casts: NONE SEEN /lpf

## 2019-01-17 ENCOUNTER — Other Ambulatory Visit: Payer: Self-pay | Admitting: Nurse Practitioner

## 2019-01-18 LAB — COMPREHENSIVE METABOLIC PANEL
ALT: 19 IU/L (ref 0–32)
AST: 13 IU/L (ref 0–40)
Albumin/Globulin Ratio: 2.1 (ref 1.2–2.2)
Albumin: 4.2 g/dL (ref 3.8–4.8)
Alkaline Phosphatase: 56 IU/L (ref 39–117)
BUN/Creatinine Ratio: 13 (ref 9–23)
BUN: 10 mg/dL (ref 6–20)
Bilirubin Total: 0.4 mg/dL (ref 0.0–1.2)
CO2: 20 mmol/L (ref 20–29)
Calcium: 9 mg/dL (ref 8.7–10.2)
Chloride: 104 mmol/L (ref 96–106)
Creatinine, Ser: 0.75 mg/dL (ref 0.57–1.00)
GFR calc Af Amer: 121 mL/min/{1.73_m2} (ref >59)
GFR calc non Af Amer: 105 mL/min/{1.73_m2} (ref >59)
Globulin, Total: 2 g/dL (ref 1.5–4.5)
Glucose: 93 mg/dL (ref 65–99)
Potassium: 4.2 mmol/L (ref 3.5–5.2)
Sodium: 138 mmol/L (ref 134–144)
Total Protein: 6.2 g/dL (ref 6.0–8.5)

## 2019-01-18 LAB — CBC WITH DIFFERENTIAL/PLATELET
Basophils Absolute: 0.1 10*3/uL (ref 0.0–0.2)
Basos: 1 %
EOS (ABSOLUTE): 0.2 10*3/uL (ref 0.0–0.4)
Eos: 2 %
Hematocrit: 40.6 % (ref 34.0–46.6)
Hemoglobin: 14.1 g/dL (ref 11.1–15.9)
Immature Grans (Abs): 0 10*3/uL (ref 0.0–0.1)
Immature Granulocytes: 0 %
Lymphocytes Absolute: 3 10*3/uL (ref 0.7–3.1)
Lymphs: 30 %
MCH: 31.1 pg (ref 26.6–33.0)
MCHC: 34.7 g/dL (ref 31.5–35.7)
MCV: 89 fL (ref 79–97)
Monocytes Absolute: 0.7 10*3/uL (ref 0.1–0.9)
Monocytes: 7 %
Neutrophils Absolute: 6 10*3/uL (ref 1.4–7.0)
Neutrophils: 60 %
Platelets: 232 10*3/uL (ref 150–450)
RBC: 4.54 x10E6/uL (ref 3.77–5.28)
RDW: 11.7 % (ref 11.7–15.4)
WBC: 10 10*3/uL (ref 3.4–10.8)

## 2019-01-18 LAB — LIPID PANEL W/O CHOL/HDL RATIO
Cholesterol, Total: 140 mg/dL (ref 100–199)
HDL: 43 mg/dL (ref >39)
LDL Calculated: 84 mg/dL (ref 0–99)
Triglycerides: 66 mg/dL (ref 0–149)
VLDL Cholesterol Cal: 13 mg/dL (ref 5–40)

## 2019-01-18 LAB — VITAMIN D 25 HYDROXY (VIT D DEFICIENCY, FRACTURES): Vit D, 25-Hydroxy: 26.5 ng/mL — ABNORMAL LOW (ref 30.0–100.0)

## 2019-01-18 LAB — T4, FREE: Free T4: 1.12 ng/dL (ref 0.82–1.77)

## 2019-01-18 LAB — TSH: TSH: 1.8 u[IU]/mL (ref 0.450–4.500)

## 2019-01-22 DIAGNOSIS — R300 Dysuria: Secondary | ICD-10-CM | POA: Insufficient documentation

## 2019-01-22 DIAGNOSIS — Z124 Encounter for screening for malignant neoplasm of cervix: Secondary | ICD-10-CM | POA: Insufficient documentation

## 2019-01-22 DIAGNOSIS — F411 Generalized anxiety disorder: Secondary | ICD-10-CM | POA: Insufficient documentation

## 2019-01-22 DIAGNOSIS — Z0001 Encounter for general adult medical examination with abnormal findings: Secondary | ICD-10-CM | POA: Insufficient documentation

## 2019-01-22 LAB — PAP IG AND HPV HIGH-RISK: HPV, high-risk: NEGATIVE

## 2019-07-09 ENCOUNTER — Ambulatory Visit (INDEPENDENT_AMBULATORY_CARE_PROVIDER_SITE_OTHER): Payer: 59 | Admitting: Adult Health

## 2019-07-09 ENCOUNTER — Other Ambulatory Visit: Payer: Self-pay

## 2019-07-09 ENCOUNTER — Encounter: Payer: Self-pay | Admitting: Adult Health

## 2019-07-09 DIAGNOSIS — N390 Urinary tract infection, site not specified: Secondary | ICD-10-CM | POA: Diagnosis not present

## 2019-07-09 MED ORDER — NITROFURANTOIN MONOHYD MACRO 100 MG PO CAPS: 100.0000 mg | ORAL_CAPSULE | Freq: Two times a day (BID) | ORAL | 0 refills | Status: DC

## 2019-07-09 NOTE — Progress Notes (Signed)
[facility] [place], [place] [address]  Internal MEDICINE  Telephone Visit  Patient Name: Jamie Kennedy  [id number]  [id number]  Date of Service: 07/21/2019  I connected with the patient at 410 by telephone and verified the patients identity using two identifiers.   I discussed the limitations, risks, security and privacy concerns of performing an evaluation and management service by telephone and the availability of in person appointments. I also discussed with the patient that there may be a patient responsible charge related to the service.  The patient expressed understanding and agrees to proceed.    Chief Complaint  Patient presents with  . Telephone Assessment  . Telephone Screen  . Dysuria    STARTED ABOUT 2 WEEKS AGO, URINARY FREQUENCY, BUT NOT ABLE TO URINATE COMPLETELY, HAS BEEN ON AND OFF, TRIED TO TREAT IT BY DRINKING MORE FLUIDS, TAKING CRANBERRY AND PROBIOTIC, SOMETIMES HAS BLADDER SPASM AND LOW ABDOMINAL CRAMPING.     HPI  PT is seen via video.  She reports about 2 weeks of urinary frequency, retention, bladder cramping/spasm.  She started having burning sensation with urination yesterday. She took a home uti test, and had leukocytes positive on that.      Current Medication: Outpatient Encounter Medications as of 07/09/2019  Medication Sig  . [DISCONTINUED] ALPRAZolam (XANAX) 0.25 MG tablet Half to one tab tab as needed for panic attacks  . [DISCONTINUED] nitrofurantoin, macrocrystal-monohydrate, (MACROBID) 100 MG capsule Take 1 capsule (100 mg total) by mouth 2 (two) times daily.   No facility-administered encounter medications on file as of 07/09/2019.     Surgical History: History reviewed. No pertinent surgical history.  Medical History: Past Medical History:  Diagnosis Date  . GAD (generalized anxiety disorder)     Family History: Family History  Problem Relation Age of Onset  . Arthritis/Rheumatoid Mother   . Anxiety  disorder Brother   . Parkinson's disease Maternal Grandfather   . Dementia Maternal Grandfather     Social History   Socioeconomic History  . Marital status: Married    Spouse name: Not on file  . Number of children: Not on file  . Years of education: Not on file  . Highest education level: Not on file  Occupational History  . Not on file  Social Needs  . Financial resource strain: Not on file  . Food insecurity    Worry: Not on file    Inability: Not on file  . Transportation needs    Medical: Not on file    Non-medical: Not on file  Tobacco Use  . Smoking status: Current Some Day Smoker    Types: Cigarettes  . Smokeless tobacco: Never Used  Substance and Sexual Activity  . Alcohol use: Never    Frequency: Never  . Drug use: Never  . Sexual activity: Not on file  Lifestyle  . Physical activity    Days per week: Not on file    Minutes per session: Not on file  . Stress: Not on file  Relationships  . Social Herbalist on phone: Not on file    Gets together: Not on file    Attends religious service: Not on file    Active member of club or organization: Not on file    Attends meetings of clubs or organizations: Not on file    Relationship status: Not on file  . Intimate partner violence    Fear of current or ex partner: Not on  file    Emotionally abused: Not on file    Physically abused: Not on file    Forced sexual activity: Not on file  Other Topics Concern  . Not on file  Social History Narrative  . Not on file      Review of Systems  Constitutional: Negative for chills, fatigue and unexpected weight change.  HENT: Negative for congestion, rhinorrhea, sneezing and sore throat.   Eyes: Negative for photophobia, pain and redness.  Respiratory: Negative for cough, chest tightness and shortness of breath.   Cardiovascular: Negative for chest pain and palpitations.  Gastrointestinal: Negative for abdominal pain, constipation, diarrhea, nausea and  vomiting.  Endocrine: Negative.   Genitourinary: Negative for dysuria and frequency.  Musculoskeletal: Negative for arthralgias, back pain, joint swelling and neck pain.  Skin: Negative for rash.  Allergic/Immunologic: Negative.   Neurological: Negative for tremors and numbness.  Hematological: Negative for adenopathy. Does not bruise/bleed easily.  Psychiatric/Behavioral: Negative for behavioral problems and sleep disturbance. The patient is not nervous/anxious.     Vital Signs: There were no vitals taken for this visit.   Observation/Objective:  Well appearing, NAD noted.    Assessment/Plan: 1. Urinary tract infection without hematuria, site unspecified Advised patient to take entire course of antibiotics as prescribed with food. Pt should return to clinic in 7-10 days if symptoms fail to improve or new symptoms develop.  - nitrofurantoin, macrocrystal-monohydrate, (MACROBID) 100 MG capsule; Take 1 capsule (100 mg total) by mouth 2 (two) times daily.  Dispense: 14 capsule; Refill: 0  General Counseling: Jamie Kennedy verbalizes understanding of the findings of today's phone visit and agrees with plan of treatment. I have discussed any further diagnostic evaluation that may be needed or ordered today. We also reviewed her medications today. she has been encouraged to call the office with any questions or concerns that should arise related to todays visit.    No orders of the defined types were placed in this encounter.   Meds ordered this encounter  Medications  . DISCONTD: nitrofurantoin, macrocrystal-monohydrate, (MACROBID) 100 MG capsule    Sig: Take 1 capsule (100 mg total) by mouth 2 (two) times daily.    Dispense:  14 capsule    Refill:  0    Time spent: [place] AGNP-C Internal medicine

## 2019-07-17 ENCOUNTER — Telehealth: Payer: Self-pay

## 2019-07-17 NOTE — Telephone Encounter (Signed)
Confirmed appointment with patient. klh °

## 2019-07-18 ENCOUNTER — Encounter: Payer: Self-pay | Admitting: Internal Medicine

## 2019-07-18 ENCOUNTER — Other Ambulatory Visit: Payer: Self-pay

## 2019-07-18 ENCOUNTER — Ambulatory Visit: Payer: 59 | Admitting: Internal Medicine

## 2019-07-18 DIAGNOSIS — F411 Generalized anxiety disorder: Secondary | ICD-10-CM

## 2019-07-18 DIAGNOSIS — R102 Pelvic and perineal pain: Secondary | ICD-10-CM

## 2019-07-18 DIAGNOSIS — R1030 Lower abdominal pain, unspecified: Secondary | ICD-10-CM | POA: Diagnosis not present

## 2019-07-18 DIAGNOSIS — R1084 Generalized abdominal pain: Secondary | ICD-10-CM

## 2019-07-18 DIAGNOSIS — R300 Dysuria: Secondary | ICD-10-CM

## 2019-07-18 LAB — POCT URINALYSIS DIPSTICK
Bilirubin, UA: NEGATIVE
Blood, UA: NEGATIVE
Glucose, UA: NEGATIVE
Ketones, UA: NEGATIVE
Leukocytes, UA: NEGATIVE
Nitrite, UA: NEGATIVE
Protein, UA: NEGATIVE
Spec Grav, UA: 1.01 (ref 1.010–1.025)
Urobilinogen, UA: 0.2 E.U./dL
pH, UA: 8 (ref 5.0–8.0)

## 2019-07-18 LAB — POCT URINE PREGNANCY: Preg Test, Ur: NEGATIVE

## 2019-07-18 MED ORDER — ALPRAZOLAM 0.25 MG PO TABS: ORAL_TABLET | ORAL | 0 refills | Status: DC

## 2019-07-18 NOTE — Progress Notes (Signed)
[facility] [place], [place] [address]  Internal MEDICINE  Office Visit Note  Patient Name: Jamie Kennedy  [id number]  [id number]  Date of Service: 07/18/2019  Chief Complaint  Patient presents with  . Urinary Tract Infection  . Abdominal Pain    on and off  . Nausea    HPI  She is here with ongoing coercers of suprapubic pain. She was treated for UTI, no cultures are available. She has finished her antibiotics. Was having heartburn, periods of diarrhea and bloating afterwards. She is sexually active and has just one partner ( husband). She has been under stress at work. No history of vaginal discharge or itching. She does think that has problem urinating at times   Current Medication: Outpatient Encounter Medications as of 07/18/2019  Medication Sig  . ALPRAZolam (XANAX) 0.25 MG tablet Half to one tab tab as needed for panic attacks  . [DISCONTINUED] ALPRAZolam (XANAX) 0.25 MG tablet Half to one tab tab as needed for panic attacks  . [DISCONTINUED] nitrofurantoin, macrocrystal-monohydrate, (MACROBID) 100 MG capsule Take 1 capsule (100 mg total) by mouth 2 (two) times daily.   No facility-administered encounter medications on file as of 07/18/2019.     Surgical History: History reviewed. No pertinent surgical history.  Medical History: Past Medical History:  Diagnosis Date  . GAD (generalized anxiety disorder)     Family History: Family History  Problem Relation Age of Onset  . Arthritis/Rheumatoid Mother   . Anxiety disorder Brother   . Parkinson's disease Maternal Grandfather   . Dementia Maternal Grandfather     Social History   Socioeconomic History  . Marital status: Married    Spouse name: Not on file  . Number of children: Not on file  . Years of education: Not on file  . Highest education level: Not on file  Occupational History  . Not on file  Social Needs  . Financial resource strain: Not on file  . Food insecurity     Worry: Not on file    Inability: Not on file  . Transportation needs    Medical: Not on file    Non-medical: Not on file  Tobacco Use  . Smoking status: Current Some Day Smoker    Types: Cigarettes  . Smokeless tobacco: Never Used  Substance and Sexual Activity  . Alcohol use: Never    Frequency: Never  . Drug use: Never  . Sexual activity: Not on file  Lifestyle  . Physical activity    Days per week: Not on file    Minutes per session: Not on file  . Stress: Not on file  Relationships  . Social Herbalist on phone: Not on file    Gets together: Not on file    Attends religious service: Not on file    Active member of club or organization: Not on file    Attends meetings of clubs or organizations: Not on file    Relationship status: Not on file  . Intimate partner violence    Fear of current or ex partner: Not on file    Emotionally abused: Not on file    Physically abused: Not on file    Forced sexual activity: Not on file  Other Topics Concern  . Not on file  Social History Narrative  . Not on file    Review of Systems  Constitutional: Negative for chills, diaphoresis and fatigue.  HENT: Negative for ear pain, postnasal drip  and sinus pressure.   Eyes: Negative for photophobia, discharge, redness, itching and visual disturbance.  Respiratory: Negative for cough, shortness of breath and wheezing.   Cardiovascular: Negative for chest pain, palpitations and leg swelling.  Gastrointestinal: Positive for abdominal distention. Negative for abdominal pain, constipation, diarrhea, nausea and vomiting.  Genitourinary: Positive for difficulty urinating, pelvic pain and urgency. Negative for dysuria and flank pain.  Musculoskeletal: Negative for arthralgias, back pain, gait problem and neck pain.  Skin: Negative for color change.  Allergic/Immunologic: Negative for environmental allergies and food allergies.  Neurological: Negative for dizziness and headaches.   Hematological: Does not bruise/bleed easily.  Psychiatric/Behavioral: Negative for agitation, behavioral problems (depression) and hallucinations. The patient is nervous/anxious.     Vital Signs: BP 132/73   Pulse 92   Temp 98.1 F (36.7 C)   Resp 16   Ht 5\' 1"  (1.549 m)   Wt 166 lb (75.3 kg)   SpO2 98%   BMI 31.37 kg/m    Physical Exam Constitutional:      Appearance: She is well-developed.  HENT:     Head: Normocephalic and atraumatic.  Abdominal:     General: Abdomen is flat.     Tenderness: There is abdominal tenderness in the suprapubic area.  Genitourinary:    Vagina: Normal.     Cervix: Discharge present.     Uterus: Normal.      Adnexa: Right adnexa normal and left adnexa normal.  Skin:    General: Skin is warm and dry.  Neurological:     General: No focal deficit present.     Mental Status: She is alert.  Psychiatric:        Mood and Affect: Mood is anxious.     Assessment/Plan: 1. Generalized abdominal pain - pt seems to be uncomfortable with pain, she does have IUD. Will get CT scan  - POCT urine pregnancy - CT Abdomen Pelvis W Contrast; Future  2. Pelvic pain - Pt has IUD, no UTI - POCT urine pregnancy - NuSwab Vaginitis Plus (VG+)  3. Dysuria - Repeat urine  - POCT Urinalysis Dipstick  4. GAD (generalized anxiety disorder) - Continue Prn xanax, Might need to add SSRI - ALPRAZolam (XANAX) 0.25 MG tablet; Half to one tab tab as needed for panic attacks  Dispense: 30 tablet; Refill: 0  General Counseling: Trichelle verbalizes understanding of the findings of todays visit and agrees with plan of treatment. I have discussed any further diagnostic evaluation that may be needed or ordered today. We also reviewed her medications today. she has been encouraged to call the office with any questions or concerns that should arise related to todays visit.  Orders Placed This Encounter  Procedures  . CT Abdomen Pelvis W Contrast  . NuSwab Vaginitis Plus  (VG+)  . POCT Urinalysis Dipstick  . POCT urine pregnancy    Meds ordered this encounter  Medications  . ALPRAZolam (XANAX) 0.25 MG tablet    Sig: Half to one tab tab as needed for panic attacks    Dispense:  30 tablet    Refill:  0    Time spent: 25 Minutes      Dr Lavera Guise Internal medicine

## 2019-07-21 LAB — NUSWAB VAGINITIS PLUS (VG+)
Candida albicans, NAA: NEGATIVE
Candida glabrata, NAA: NEGATIVE
Chlamydia trachomatis, NAA: NEGATIVE
Neisseria gonorrhoeae, NAA: NEGATIVE
Trich vag by NAA: NEGATIVE

## 2019-07-22 NOTE — Progress Notes (Signed)
Negative.

## 2019-07-31 ENCOUNTER — Telehealth: Payer: Self-pay

## 2019-07-31 DIAGNOSIS — R300 Dysuria: Secondary | ICD-10-CM

## 2019-08-01 NOTE — Telephone Encounter (Signed)
Patient has been notified. Jamie Kennedy

## 2019-08-02 ENCOUNTER — Ambulatory Visit: Admission: RE | Admit: 2019-08-02 | Payer: 59 | Source: Ambulatory Visit

## 2019-08-02 LAB — URINALYSIS
Bilirubin, UA: NEGATIVE
Glucose, UA: NEGATIVE
Ketones, UA: NEGATIVE
Nitrite, UA: POSITIVE — AB
Protein,UA: NEGATIVE
RBC, UA: NEGATIVE
Specific Gravity, UA: 1.018 (ref 1.005–1.030)
Urobilinogen, Ur: 1 mg/dL (ref 0.2–1.0)
pH, UA: 5.5 (ref 5.0–7.5)

## 2019-08-02 MED ORDER — CIPROFLOXACIN HCL 500 MG PO TABS: 500.0000 mg | ORAL_TABLET | Freq: Two times a day (BID) | ORAL | 0 refills | Status: DC

## 2019-08-02 NOTE — Telephone Encounter (Signed)
PT NOTIFIED OF CIPRO SENT TO PHARMACY , PT IS SCHEDULED FOR CT PER DR KHAN WE CAN WAIT AND SEE HOW SHE FEELS

## 2019-08-02 NOTE — Addendum Note (Signed)
Addended by: Lavera Guise on: 08/02/2019 07:41 AM   Modules accepted: Orders

## 2019-08-06 ENCOUNTER — Other Ambulatory Visit: Payer: 59

## 2019-08-06 ENCOUNTER — Ambulatory Visit: Payer: 59 | Admitting: Internal Medicine

## 2019-08-26 ENCOUNTER — Other Ambulatory Visit: Payer: Self-pay | Admitting: Adult Health

## 2019-08-26 ENCOUNTER — Telehealth: Payer: Self-pay

## 2019-08-26 MED ORDER — FLUCONAZOLE 150 MG PO TABS: 150.0000 mg | ORAL_TABLET | ORAL | 0 refills | Status: DC | PRN

## 2019-08-26 NOTE — Progress Notes (Signed)
Sent RX for Diflucan for yeast proliferation

## 2019-08-27 NOTE — Telephone Encounter (Signed)
Diflucan called into pharmacy. beth

## 2019-09-13 ENCOUNTER — Ambulatory Visit: Payer: 59 | Admitting: Nurse Practitioner

## 2019-09-13 ENCOUNTER — Encounter: Payer: Self-pay | Admitting: Nurse Practitioner

## 2019-09-13 VITALS — Ht 61.0 in | Wt 160.0 lb

## 2019-09-13 DIAGNOSIS — N390 Urinary tract infection, site not specified: Secondary | ICD-10-CM | POA: Diagnosis not present

## 2019-09-13 DIAGNOSIS — B373 Candidiasis of vulva and vagina: Secondary | ICD-10-CM | POA: Diagnosis not present

## 2019-09-13 DIAGNOSIS — B3731 Acute candidiasis of vulva and vagina: Secondary | ICD-10-CM | POA: Insufficient documentation

## 2019-09-13 MED ORDER — CLOTRIMAZOLE 3 2 % VA CREA: 1.0000 | TOPICAL_CREAM | Freq: Every day | VAGINAL | 0 refills | Status: DC

## 2019-09-13 MED ORDER — SULFAMETHOXAZOLE-TRIMETHOPRIM 800-160 MG PO TABS: 1.0000 | ORAL_TABLET | Freq: Two times a day (BID) | ORAL | 0 refills | Status: DC

## 2019-09-13 NOTE — Progress Notes (Signed)
[facility] [place], [place] [address]  Internal MEDICINE  Telephone Visit  Patient Name: Jamie Kennedy  [id number]  [id number]  Date of Service: 09/13/2019  I connected with the patient at 9:10am by webcam and verified the patients identity using two identifiers.   I discussed the limitations, risks, security and privacy concerns of performing an evaluation and management service by webcam and the availability of in person appointments. I also discussed with the patient that there may be a patient responsible charge related to the service.  The patient expressed understanding and agrees to proceed.    Chief Complaint  Patient presents with  . Telephone Assessment  . Telephone Screen  . Dysuria    ISSUES SINCE NOVEMBER, HAS BEEN ON FEW DIFFERENT ABX, CIPRO HELPED FOR FEW WEEKS, FOR THE PAST  WEEK FREQUENCY, INTERMITTENT STRAIN ; CT SCAN WAS SCHEDULED BUT CANCELED BECAUSE LAST URINE TEST SHOW POS FOR UTI, TAKES TWO SUPPLEMENT FOR ISSUES ALONG WITH PROBIOTIC.     The patient has been contacted via webcam for follow up visit due to concerns for spread of novel coronavirus. The patient presents for acute visit. States that she started having urinary issues back in November. Most importantly, she is having urinary frequency. Urine stream is slow and intermittent.  She has feeling of fullness and pressure in the bladder. Symptoms started off as intermittent, bothering her some days and not others. Was treated with round of Macrobid after symptoms were present for two weeks. This did not help at all. Was seen in the office. Urinalysis was negative for infection. A CT scan was ordered for abdomen and pelvis. Prior to getting CT scan, symptoms became much worse. She repeated the u/a which was then positive. Was treated with cipro and CT was cancelled. States that cipro did resolve the symptoms. She developed symptome of yeast infection. She states that diflucan made her have  severe headache. She states that since she had these symptoms, she has tried some natural remedies to keep them from occurring again. Recently used a vaginal suppository to help restore the normal vaginal flora. After that, she started to develop urinary frequency and slow urine stream again. Symptoms very similar to those which started back in November.       Current Medication: Outpatient Encounter Medications as of 09/13/2019  Medication Sig  . ALPRAZolam (XANAX) 0.25 MG tablet Half to one tab tab as needed for panic attacks  . fluconazole (DIFLUCAN) 150 MG tablet Take 1 tablet (150 mg total) by mouth every three (3) days as needed.  . clotrimazole (CLOTRIMAZOLE 3) 2 % vaginal cream Place 1 Applicatorful vaginally at bedtime.  . sulfamethoxazole-trimethoprim (BACTRIM DS) 800-160 MG tablet Take 1 tablet by mouth 2 (two) times daily.  . [DISCONTINUED] ciprofloxacin (CIPRO) 500 MG tablet Take 1 tablet (500 mg total) by mouth 2 (two) times daily. (Patient not taking: Reported on 09/13/2019)   No facility-administered encounter medications on file as of 09/13/2019.    Surgical History: History reviewed. No pertinent surgical history.  Medical History: Past Medical History:  Diagnosis Date  . GAD (generalized anxiety disorder)     Family History: Family History  Problem Relation Age of Onset  . Arthritis/Rheumatoid Mother   . Anxiety disorder Brother   . Parkinson's disease Maternal Grandfather   . Dementia Maternal Grandfather     Social History   Socioeconomic History  . Marital status: Married    Spouse name: Not on file  . Number  of children: Not on file  . Years of education: Not on file  . Highest education level: Not on file  Occupational History  . Not on file  Tobacco Use  . Smoking status: Current Some Day Smoker    Types: Cigarettes  . Smokeless tobacco: Never Used  Substance and Sexual Activity  . Alcohol use: Never  . Drug use: Never  . Sexual activity: Not  on file  Other Topics Concern  . Not on file  Social History Narrative  . Not on file   Social Determinants of Health   Financial Resource Strain:   . Difficulty of Paying Living Expenses: Not on file  Food Insecurity:   . Worried About Charity fundraiser in the Last Year: Not on file  . Ran Out of Food in the Last Year: Not on file  Transportation Needs:   . Lack of Transportation (Medical): Not on file  . Lack of Transportation (Non-Medical): Not on file  Physical Activity:   . Days of Exercise per Week: Not on file  . Minutes of Exercise per Session: Not on file  Stress:   . Feeling of Stress : Not on file  Social Connections:   . Frequency of Communication with Friends and Family: Not on file  . Frequency of Social Gatherings with Friends and Family: Not on file  . Attends Religious Services: Not on file  . Active Member of Clubs or Organizations: Not on file  . Attends Archivist Meetings: Not on file  . Marital Status: Not on file  Intimate Partner Violence:   . Fear of Current or Ex-Partner: Not on file  . Emotionally Abused: Not on file  . Physically Abused: Not on file  . Sexually Abused: Not on file      Review of Systems  Constitutional: Negative for chills, diaphoresis and fatigue.  HENT: Negative for ear pain, postnasal drip and sinus pressure.   Respiratory: Negative for cough, shortness of breath and wheezing.   Cardiovascular: Negative for chest pain, palpitations and leg swelling.  Gastrointestinal: Positive for abdominal distention. Negative for abdominal pain, constipation, diarrhea, nausea and vomiting.  Endocrine: Positive for polyuria. Negative for cold intolerance, heat intolerance and polyphagia.  Genitourinary: Positive for decreased urine volume, difficulty urinating, frequency, pelvic pain and urgency. Negative for dysuria and flank pain.  Musculoskeletal: Negative for arthralgias, back pain, gait problem and neck pain.  Skin:  Negative for color change.  Allergic/Immunologic: Negative for environmental allergies and food allergies.  Neurological: Negative for dizziness and headaches.  Hematological: Does not bruise/bleed easily.  Psychiatric/Behavioral: Negative for agitation, behavioral problems (depression) and hallucinations. The patient is nervous/anxious.     Today's Vitals   09/13/19 0849  Weight: 160 lb (72.6 kg)  Height: 5\' 1"  (1.549 m)   Body mass index is 30.23 kg/m.  Observation/Objective:   The patient is alert and oriented. She is pleasant and answers all questions appropriately. Breathing is non-labored. She is in no acute distress at this time.    Assessment/Plan: 1. Urinary tract infection without hematuria, site unspecified Start bactrim Ds bid for 10 days.  Patient to provide urine sample at next visit to ensure clearance of possible infection - sulfamethoxazole-trimethoprim (BACTRIM DS) 800-160 MG tablet; Take 1 tablet by mouth 2 (two) times daily.  Dispense: 20 tablet; Refill: 0  2. Vaginal yeast infection Clotrimazole vaginal cream should be used nightly for 5 nights if yeast infection develops from antibiotic use.  - clotrimazole (CLOTRIMAZOLE 3)  2 % vaginal cream; Place 1 Applicatorful vaginally at bedtime.  Dispense: 21 g; Refill: 0  General Counseling: Patte verbalizes understanding of the findings of today's phone visit and agrees with plan of treatment. I have discussed any further diagnostic evaluation that may be needed or ordered today. We also reviewed her medications today. she has been encouraged to call the office with any questions or concerns that should arise related to todays visit.  This patient was seen by [place] with Dr Lavera Guise as a part of collaborative care agreement  Meds ordered this encounter  Medications  . sulfamethoxazole-trimethoprim (BACTRIM DS) 800-160 MG tablet    Sig: Take 1 tablet by mouth 2 (two) times daily.     Dispense:  20 tablet    Refill:  0    Order Specific Question:   Supervising Provider    Answer:   Lavera Guise [id number]  . clotrimazole (CLOTRIMAZOLE 3) 2 % vaginal cream    Sig: Place 1 Applicatorful vaginally at bedtime.    Dispense:  21 g    Refill:  0    Order Specific Question:   Supervising Provider    Answer:   Lavera Guise [id number]    Time spent: 56 Minutes    Dr Lavera Guise Internal medicine

## 2019-09-23 ENCOUNTER — Telehealth: Payer: Self-pay | Admitting: Obstetrics and Gynecology

## 2019-09-23 NOTE — Telephone Encounter (Signed)
Noted. Mirena reserved for this patient. 

## 2019-09-23 NOTE — Telephone Encounter (Signed)
Patient is schedule for thursday, 09/26/19 at 8:50 with ABC

## 2019-09-24 ENCOUNTER — Other Ambulatory Visit: Payer: Self-pay | Admitting: Nurse Practitioner

## 2019-09-24 ENCOUNTER — Telehealth: Payer: Self-pay

## 2019-09-24 DIAGNOSIS — L2089 Other atopic dermatitis: Secondary | ICD-10-CM

## 2019-09-24 MED ORDER — TRIAMCINOLONE ACETONIDE 0.025 % EX CREA: 1.0000 "application " | TOPICAL_CREAM | Freq: Two times a day (BID) | CUTANEOUS | 2 refills | Status: DC

## 2019-09-24 NOTE — Telephone Encounter (Signed)
Pt advised that take otc  Benadryl  And triamcinolone and also pt went to the dermatology she said that its not from bactrim but iadvised pt to hold bactrim for now and she had a appt Friday we can discuss in detailed at visit

## 2019-09-24 NOTE — Telephone Encounter (Signed)
When did this happen? Prescription was given to her 09/13/2019? Anyway, Sent prescription for triamcinolone cream to pharmacy. She should apply this twice daily as needed for rash. She can take OTC benadryl for itching and allergic symptoms. If no improvement, we can send prednisone taper.

## 2019-09-24 NOTE — Progress Notes (Signed)
Sent prescription for triamcinolone cream to pharmacy. She should apply this twice daily as needed for rash. She can take OTC benadryl for itching and allergic symptoms.

## 2019-09-25 ENCOUNTER — Telehealth: Payer: Self-pay

## 2019-09-25 NOTE — Progress Notes (Signed)
Kendell Bane, NP   Chief Complaint  Patient presents with  . IUD Removal  . Urinary Tract Infection    recurrent uti's since Nov    HPI:      Ms. NOHEALANI LAITY is a 34 y.o. No obstetric history on file. who LMP was Patient's last menstrual period was 09/19/2019 (exact date)., presents today for recurrent UTI sx with bladder pain/pelvic discomfort. Sx intermittent and recurrent since 11/20. Sx include frequency and hesitancy, sometimes with urethral pain. Pt has been on several rounds of abx with PCP. Had 2 virtual visits (no UA/C&S done) and 1 in person visit with pos C&S, treated with cipro with sx relief 12/20. Sx recurred and pt recently placed on bactrim BID for 10 days. Only completed 6 days due to development of rash. Last abx 2 days ago. Having bladder pain/urethral pain since last night. No hematuria. Drinks coffee daily. No dyspareunia.  Did develop vag d/c with vaginal discomfort after cipro. Treated with monistat and diflucan x 2 without relief. No thick white vag d/c, no itch. Had neg STD testing with PCP 11/20. No new sex partners. Taking probiotics now.  Pt had Mirena placed 10/16/14. Would like it removed in case cause of sx. Had seen PCP who was going to order CT. Pt amenorrheic with Mirena in past but did have bleeding last wk. No vag bleeding today. Plans to use condoms in meantime. If bladder sx resolve, pt may want another IUD in near future.   Neg pap 01/14/19  Past Medical History:  Diagnosis Date  . GAD (generalized anxiety disorder)     History reviewed. No pertinent surgical history.  Family History  Problem Relation Age of Onset  . Arthritis/Rheumatoid Mother   . Anxiety disorder Brother   . Parkinson's disease Maternal Grandfather   . Dementia Maternal Grandfather     Social History   Socioeconomic History  . Marital status: Married    Spouse name: Not on file  . Number of children: Not on file  . Years of education: Not on file  .  Highest education level: Not on file  Occupational History  . Not on file  Tobacco Use  . Smoking status: Current Some Day Smoker    Types: Cigarettes  . Smokeless tobacco: Never Used  Substance and Sexual Activity  . Alcohol use: Never  . Drug use: Never  . Sexual activity: Yes    Birth control/protection: I.U.D.    Comment: Mirena  Other Topics Concern  . Not on file  Social History Narrative  . Not on file   Social Determinants of Health   Financial Resource Strain:   . Difficulty of Paying Living Expenses: Not on file  Food Insecurity:   . Worried About Charity fundraiser in the Last Year: Not on file  . Ran Out of Food in the Last Year: Not on file  Transportation Needs:   . Lack of Transportation (Medical): Not on file  . Lack of Transportation (Non-Medical): Not on file  Physical Activity:   . Days of Exercise per Week: Not on file  . Minutes of Exercise per Session: Not on file  Stress:   . Feeling of Stress : Not on file  Social Connections:   . Frequency of Communication with Friends and Family: Not on file  . Frequency of Social Gatherings with Friends and Family: Not on file  . Attends Religious Services: Not on file  . Active Member  of Clubs or Organizations: Not on file  . Attends Archivist Meetings: Not on file  . Marital Status: Not on file  Intimate Partner Violence:   . Fear of Current or Ex-Partner: Not on file  . Emotionally Abused: Not on file  . Physically Abused: Not on file  . Sexually Abused: Not on file    Outpatient Medications Prior to Visit  Medication Sig Dispense Refill  . ALPRAZolam (XANAX) 0.25 MG tablet Half to one tab tab as needed for panic attacks 30 tablet 0  . sulfamethoxazole-trimethoprim (BACTRIM DS) 800-160 MG tablet Take 1 tablet by mouth 2 (two) times daily. (Patient not taking: Reported on 09/26/2019) 20 tablet 0  . triamcinolone (KENALOG) 0.025 % cream Apply 1 application topically 2 (two) times daily.  (Patient not taking: Reported on 09/26/2019) 80 g 2  . clotrimazole (CLOTRIMAZOLE 3) 2 % vaginal cream Place 1 Applicatorful vaginally at bedtime. 21 g 0  . fluconazole (DIFLUCAN) 150 MG tablet Take 1 tablet (150 mg total) by mouth every three (3) days as needed. 3 tablet 0   No facility-administered medications prior to visit.      ROS:  Review of Systems  Constitutional: Negative for fever.  Gastrointestinal: Negative for blood in stool, constipation, diarrhea, nausea and vomiting.  Genitourinary: Positive for dysuria, frequency, pelvic pain and vaginal discharge. Negative for dyspareunia, flank pain, hematuria, urgency, vaginal bleeding and vaginal pain.  Musculoskeletal: Negative for back pain.  Skin: Negative for rash.  BREAST: No symptoms   OBJECTIVE:   Vitals:  BP 110/90   Ht 5\' 1"  (1.549 m)   Wt 161 lb (73 kg)   LMP 09/19/2019 (Exact Date)   BMI 30.42 kg/m   Physical Exam Vitals reviewed.  Constitutional:      Appearance: She is well-developed.  Pulmonary:     Effort: Pulmonary effort is normal.  Genitourinary:    General: Normal vulva.     Pubic Area: No rash.      Labia:        Right: No rash, tenderness or lesion.        Left: No rash, tenderness or lesion.      Vagina: Normal. No vaginal discharge, erythema or tenderness.     Cervix: Normal.     Uterus: Normal. Not enlarged and not tender.      Adnexa: Right adnexa normal and left adnexa normal.       Right: No mass or tenderness.         Left: No mass or tenderness.       Comments: IUD STRINGS IN CX OS Musculoskeletal:        General: Normal range of motion.     Cervical back: Normal range of motion.  Skin:    General: Skin is warm and dry.  Neurological:     General: No focal deficit present.     Mental Status: She is alert and oriented to person, place, and time.  Psychiatric:        Mood and Affect: Mood normal.        Behavior: Behavior normal.        Thought Content: Thought content normal.         Judgment: Judgment normal.     Results: Results for orders placed or performed in visit on 09/26/19 (from the past 24 hour(s))  POCT Urinalysis Dipstick     Status: Abnormal   Collection Time: 09/26/19  9:38 AM  Result Value Ref Range  Color, UA yellow    Clarity, UA clear    Glucose, UA Negative Negative   Bilirubin, UA neg    Ketones, UA pos    Spec Grav, UA 1.015 1.010 - 1.025   Blood, UA mod    pH, UA 6.0 5.0 - 8.0   Protein, UA Negative Negative   Urobilinogen, UA     Nitrite, UA neg    Leukocytes, UA Negative Negative   Appearance     Odor     NO VAG BLEEDING ON EXAM  IUD Removal Strings of IUD identified and grasped.  IUD removed without problem with ring forceps.  Pt tolerated this well.  IUD noted to be intact.   Assessment/Plan: UTI symptoms - Plan: POCT Urinalysis Dipstick, Urine Culture, Urinalysis, Routine w reflex microscopic, Ambulatory referral to Urology; Pos sx, hematuria on POC UA. Check UA and C&S. Will f/u if pos. Refer to urology.   Urinary hesitancy - Plan: Ambulatory referral to Urology  Encounter for removal of intrauterine contraceptive device (IUD)--pt tolerated well. Condoms in meantime. RTO for IUD insertion with menses if desires again.     Return if symptoms worsen or fail to improve.  Annet Manukyan B. Madysun Thall, PA-C 09/26/2019 9:45 AM

## 2019-09-25 NOTE — Telephone Encounter (Signed)
LMOM TO CONFIRM AND SCREEN FOR 09-27-19 OV.

## 2019-09-26 ENCOUNTER — Ambulatory Visit (INDEPENDENT_AMBULATORY_CARE_PROVIDER_SITE_OTHER): Payer: 59 | Admitting: Obstetrics and Gynecology

## 2019-09-26 ENCOUNTER — Encounter: Payer: Self-pay | Admitting: Obstetrics and Gynecology

## 2019-09-26 ENCOUNTER — Other Ambulatory Visit: Payer: Self-pay

## 2019-09-26 VITALS — BP 110/90 | Ht 61.0 in | Wt 161.0 lb

## 2019-09-26 DIAGNOSIS — N898 Other specified noninflammatory disorders of vagina: Secondary | ICD-10-CM | POA: Diagnosis not present

## 2019-09-26 DIAGNOSIS — R102 Pelvic and perineal pain: Secondary | ICD-10-CM | POA: Diagnosis not present

## 2019-09-26 DIAGNOSIS — Z30432 Encounter for removal of intrauterine contraceptive device: Secondary | ICD-10-CM | POA: Diagnosis not present

## 2019-09-26 DIAGNOSIS — R300 Dysuria: Secondary | ICD-10-CM

## 2019-09-26 DIAGNOSIS — R3911 Hesitancy of micturition: Secondary | ICD-10-CM

## 2019-09-26 DIAGNOSIS — R399 Unspecified symptoms and signs involving the genitourinary system: Secondary | ICD-10-CM

## 2019-09-26 LAB — POCT URINALYSIS DIPSTICK
Bilirubin, UA: NEGATIVE
Glucose, UA: NEGATIVE
Ketones, UA: POSITIVE
Leukocytes, UA: NEGATIVE
Nitrite, UA: NEGATIVE
Protein, UA: NEGATIVE
Spec Grav, UA: 1.015 (ref 1.010–1.025)
pH, UA: 6 (ref 5.0–8.0)

## 2019-09-26 NOTE — Patient Instructions (Signed)
I value your feedback and entrusting us with your care. If you get a [facility] patient survey, I would appreciate you taking the time to let us know about your experience today. Thank you!  As of July 25, 2019, your lab results will be released to your MyChart immediately, before I even have a chance to see them. Please give me time to review them and contact you if there are any abnormalities. Thank you for your patience.  

## 2019-09-27 ENCOUNTER — Telehealth: Payer: Self-pay

## 2019-09-27 ENCOUNTER — Other Ambulatory Visit: Payer: Self-pay

## 2019-09-27 ENCOUNTER — Encounter: Payer: Self-pay | Admitting: Nurse Practitioner

## 2019-09-27 ENCOUNTER — Ambulatory Visit: Payer: 59 | Admitting: Nurse Practitioner

## 2019-09-27 VITALS — BP 107/73 | HR 97 | Temp 97.3°F | Resp 16 | Ht 61.0 in | Wt 161.2 lb

## 2019-09-27 DIAGNOSIS — N3289 Other specified disorders of bladder: Secondary | ICD-10-CM

## 2019-09-27 DIAGNOSIS — R102 Pelvic and perineal pain: Secondary | ICD-10-CM

## 2019-09-27 DIAGNOSIS — R319 Hematuria, unspecified: Secondary | ICD-10-CM | POA: Insufficient documentation

## 2019-09-27 DIAGNOSIS — R300 Dysuria: Secondary | ICD-10-CM | POA: Diagnosis not present

## 2019-09-27 LAB — POCT URINALYSIS DIPSTICK
Glucose, UA: NEGATIVE
Ketones, UA: NEGATIVE
Leukocytes, UA: NEGATIVE
Nitrite, UA: NEGATIVE
Protein, UA: NEGATIVE
Spec Grav, UA: 1.015 (ref 1.010–1.025)
Urobilinogen, UA: NEGATIVE E.U./dL — AB
pH, UA: 6.5 (ref 5.0–8.0)

## 2019-09-27 LAB — URINALYSIS, ROUTINE W REFLEX MICROSCOPIC
Bilirubin, UA: NEGATIVE
Glucose, UA: NEGATIVE
Leukocytes,UA: NEGATIVE
Nitrite, UA: NEGATIVE
Protein,UA: NEGATIVE
RBC, UA: NEGATIVE
Specific Gravity, UA: 1.021 (ref 1.005–1.030)
Urobilinogen, Ur: 0.2 mg/dL (ref 0.2–1.0)
pH, UA: 6 (ref 5.0–7.5)

## 2019-09-27 NOTE — Progress Notes (Signed)
[facility] [place], [place] [address]  Internal MEDICINE  Office Visit Note  Patient Name: Jamie Kennedy  [id number]  [id number]  Date of Service: 09/27/2019  Chief Complaint  Patient presents with  . Follow-up    feels like she cant finish urinating,burning in bladder,  . Anxiety    The patient is here for routine follow up visit. She continues to feel as though she is not emptying her bladder completely. She states that she constantly feels as though she has to urinate. She was treated for UTI at her last visit. Finished antibiotics. Two days ago, she had sharp, shooting pain in the bladder area. Feels as though she is being stabbed in the area of the urethra. She did see her GYN provider yesterday. She had her Mirena IUD removed. Urine dip, prior to the removal did show moderate blood. That is again reflected in today's urine sample. Her GYN did order a urology consultation with [facility]. She will not be seen until March 8.       Current Medication: Outpatient Encounter Medications as of 09/27/2019  Medication Sig  . ALPRAZolam (XANAX) 0.25 MG tablet Half to one tab tab as needed for panic attacks  . sulfamethoxazole-trimethoprim (BACTRIM DS) 800-160 MG tablet Take 1 tablet by mouth 2 (two) times daily.  Marland Kitchen triamcinolone (KENALOG) 0.025 % cream Apply 1 application topically 2 (two) times daily.   No facility-administered encounter medications on file as of 09/27/2019.    Surgical History: History reviewed. No pertinent surgical history.  Medical History: Past Medical History:  Diagnosis Date  . GAD (generalized anxiety disorder)     Family History: Family History  Problem Relation Age of Onset  . Arthritis/Rheumatoid Mother   . Anxiety disorder Brother   . Parkinson's disease Maternal Grandfather   . Dementia Maternal Grandfather     Social History   Socioeconomic History  . Marital status: Married    Spouse name: Not on file   . Number of children: Not on file  . Years of education: Not on file  . Highest education level: Not on file  Occupational History  . Not on file  Tobacco Use  . Smoking status: Current Some Day Smoker    Types: Cigarettes  . Smokeless tobacco: Never Used  Substance and Sexual Activity  . Alcohol use: Never  . Drug use: Never  . Sexual activity: Yes    Birth control/protection: I.U.D.    Comment: Mirena  Other Topics Concern  . Not on file  Social History Narrative  . Not on file   Social Determinants of Health   Financial Resource Strain:   . Difficulty of Paying Living Expenses: Not on file  Food Insecurity:   . Worried About Charity fundraiser in the Last Year: Not on file  . Ran Out of Food in the Last Year: Not on file  Transportation Needs:   . Lack of Transportation (Medical): Not on file  . Lack of Transportation (Non-Medical): Not on file  Physical Activity:   . Days of Exercise per Week: Not on file  . Minutes of Exercise per Session: Not on file  Stress:   . Feeling of Stress : Not on file  Social Connections:   . Frequency of Communication with Friends and Family: Not on file  . Frequency of Social Gatherings with Friends and Family: Not on file  . Attends Religious Services: Not on file  . Active Member of Clubs  or Organizations: Not on file  . Attends Archivist Meetings: Not on file  . Marital Status: Not on file  Intimate Partner Violence:   . Fear of Current or Ex-Partner: Not on file  . Emotionally Abused: Not on file  . Physically Abused: Not on file  . Sexually Abused: Not on file      Review of Systems  Constitutional: Negative for chills, diaphoresis and fatigue.  HENT: Negative for ear pain, postnasal drip and sinus pressure.   Respiratory: Negative for cough, shortness of breath and wheezing.   Cardiovascular: Negative for chest pain, palpitations and leg swelling.  Gastrointestinal: Negative for abdominal distention,  abdominal pain, constipation, diarrhea, nausea and vomiting.  Endocrine: Positive for polyuria. Negative for cold intolerance, heat intolerance and polyphagia.  Genitourinary: Positive for decreased urine volume, difficulty urinating, frequency, pelvic pain and urgency. Negative for dysuria and flank pain.  Musculoskeletal: Negative for arthralgias, back pain, gait problem and neck pain.  Skin: Negative for color change.  Allergic/Immunologic: Negative for environmental allergies and food allergies.  Neurological: Negative for dizziness and headaches.  Hematological: Does not bruise/bleed easily.  Psychiatric/Behavioral: Negative for agitation, behavioral problems (depression) and hallucinations. The patient is nervous/anxious.     Today's Vitals   09/27/19 1052  BP: 107/73  Pulse: 97  Resp: 16  Temp: (!) 97.3 F (36.3 C)  SpO2: 96%  Weight: 161 lb 3.2 oz (73.1 kg)  Height: 5\' 1"  (1.549 m)   Body mass index is 30.46 kg/m.  Physical Exam Vitals and nursing note reviewed.  Constitutional:      General: She is not in acute distress.    Appearance: Normal appearance. She is well-developed. She is not diaphoretic.  HENT:     Head: Normocephalic and atraumatic.     Mouth/Throat:     Pharynx: No oropharyngeal exudate.  Eyes:     Pupils: Pupils are equal, round, and reactive to light.  Neck:     Thyroid: No thyromegaly.     Vascular: No JVD.     Trachea: No tracheal deviation.  Cardiovascular:     Rate and Rhythm: Normal rate and regular rhythm.     Heart sounds: Normal heart sounds. No murmur. No friction rub. No gallop.   Pulmonary:     Effort: Pulmonary effort is normal. No respiratory distress.     Breath sounds: Normal breath sounds. No wheezing or rales.  Chest:     Chest wall: No tenderness.  Abdominal:     General: Bowel sounds are normal.     Palpations: Abdomen is soft.     Tenderness: There is abdominal tenderness.     Comments: There is moderate tenderness with  palpation of suprapubic area.  Genitourinary:    Comments: U/a today showing moderate blood. Musculoskeletal:        General: Normal range of motion.     Cervical back: Normal range of motion and neck supple.  Lymphadenopathy:     Cervical: No cervical adenopathy.  Skin:    General: Skin is warm and dry.  Neurological:     Mental Status: She is alert and oriented to person, place, and time.     Cranial Nerves: No cranial nerve deficit.  Psychiatric:        Behavior: Behavior normal.        Thought Content: Thought content normal.        Judgment: Judgment normal.    Assessment/Plan: 1. Hematuria, unspecified type U/a showing moderate blood. Will  ultrasound pelvis, including bladder for further evaluation. Patient has scheduled appointment with urology.   2. Bladder spasms Will ultrasound pelvis, including bladder for further evaluation. Patient has scheduled appointment with urology.  - [place], retroperitnl abd,  ltd - US Pelvis Complete; Future  3. Pelvic pain Will ultrasound pelvis, including bladder for further evaluation. Patient has scheduled appointment with urology.  - US Pelvis Complete; Future  4. Dysuria U/a positive for moderate blood. Will get ultrasound of the bladder for further evaluation.  - POCT Urinalysis Dipstick - [place], retroperitnl abd,  ltd  General Counseling: Lillyan verbalizes understanding of the findings of todays visit and agrees with plan of treatment. I have discussed any further diagnostic evaluation that may be needed or ordered today. We also reviewed her medications today. she has been encouraged to call the office with any questions or concerns that should arise related to todays visit.  This patient was seen by Leretha Pol FNP Collaboration with Dr Lavera Guise as a part of collaborative care agreement  Orders Placed This Encounter  Procedures  . US Pelvis Complete  . [place], retroperitnl abd,  ltd  . POCT Urinalysis Dipstick    Total time  spent: 30 Minutes   Time spent includes review of chart, medications, test results, and follow up plan with the patient.      Dr Lavera Guise Internal medicine

## 2019-09-27 NOTE — Telephone Encounter (Signed)
Confirmed appointment on 09/27/2019 and screened for covid. klh

## 2019-09-27 NOTE — Telephone Encounter (Signed)
Pt declined Mirena currently. May r/s in the near future. Mirena placed back in stock.

## 2019-09-28 LAB — URINE CULTURE: Organism ID, Bacteria: NO GROWTH

## 2019-09-28 MED ORDER — URIBEL 118 MG PO CAPS: 118.0000 mg | ORAL_CAPSULE | Freq: Four times a day (QID) | ORAL | 0 refills | Status: DC | PRN

## 2019-09-28 NOTE — Addendum Note (Signed)
Addended by: Ardeth Perfect B on: [id number] 03:19 PM   Modules accepted: Orders

## 2019-10-01 ENCOUNTER — Telehealth: Payer: Self-pay

## 2019-10-01 NOTE — Telephone Encounter (Signed)
Called lmom informing patient ultrasound scheduled at [facility] on 10/02/2019 at 1:00pm arrive @ 12:45 pm with full bladder.  [phone]

## 2019-10-02 ENCOUNTER — Ambulatory Visit
Admission: RE | Admit: 2019-10-02 | Discharge: 2019-10-02 | Disposition: A | Discharge status: Home / Self Care | Payer: 59 | Source: Ambulatory Visit | Attending: Nurse Practitioner | Admitting: Nurse Practitioner

## 2019-10-02 ENCOUNTER — Other Ambulatory Visit: Payer: Self-pay

## 2019-10-02 ENCOUNTER — Other Ambulatory Visit: Payer: Self-pay | Admitting: Nurse Practitioner

## 2019-10-02 DIAGNOSIS — R339 Retention of urine, unspecified: Secondary | ICD-10-CM

## 2019-10-02 DIAGNOSIS — N3289 Other specified disorders of bladder: Secondary | ICD-10-CM | POA: Insufficient documentation

## 2019-10-02 DIAGNOSIS — R102 Pelvic and perineal pain: Secondary | ICD-10-CM | POA: Insufficient documentation

## 2019-10-03 NOTE — Progress Notes (Signed)
Mall post-void residual urine volume. Small left renal cyst. She has already been referred to urology for further evaluation.

## 2019-10-03 NOTE — Progress Notes (Signed)
Normal transabdominal and transpelvic ultrasound. Review at follow up 10/15/2019

## 2019-10-04 ENCOUNTER — Other Ambulatory Visit: Payer: 59

## 2019-10-11 ENCOUNTER — Telehealth: Payer: Self-pay

## 2019-10-11 NOTE — Telephone Encounter (Signed)
CONFIRMED AND SCREENED FOR 10-15-19 OV.

## 2019-10-15 ENCOUNTER — Ambulatory Visit (INDEPENDENT_AMBULATORY_CARE_PROVIDER_SITE_OTHER): Payer: 59 | Admitting: Nurse Practitioner

## 2019-10-15 ENCOUNTER — Other Ambulatory Visit: Payer: Self-pay

## 2019-10-15 ENCOUNTER — Encounter: Payer: Self-pay | Admitting: Nurse Practitioner

## 2019-10-15 VITALS — BP 105/82 | HR 74 | Temp 97.9°F | Resp 16 | Ht 61.0 in | Wt 165.0 lb

## 2019-10-15 DIAGNOSIS — K581 Irritable bowel syndrome with constipation: Secondary | ICD-10-CM | POA: Diagnosis not present

## 2019-10-15 DIAGNOSIS — R102 Pelvic and perineal pain: Secondary | ICD-10-CM

## 2019-10-15 DIAGNOSIS — N3289 Other specified disorders of bladder: Secondary | ICD-10-CM

## 2019-10-15 NOTE — Progress Notes (Signed)
[facility] [place], [place] [address]  Internal MEDICINE  Office Visit Note  Patient Name: Jamie Kennedy  [id number]  [id number]  Date of Service: 10/20/2019  Chief Complaint  Patient presents with  . Follow-up    U/s    The patient is here for follow up. She did have ultrasound of the bladder and kidneys after having severe pain in the bladder and urethra area. Ultrasound indicated very small postvoid residual urine in the bladder, but was otherwise unremarkable. Imaging of the pelvic organs was also negative. She did have blood present in the urine at her last office visit and at her visit with GYN. She is scheduled to have appointment with urology 10/21/2019. She states that symptom have resolved since her last visit.  She admits to having a great deal of issue with irritable bowel with constipation.  She frequently strains to move her bowels. Is concerned that this may contribute to her issues with the bladder.       Current Medication: Outpatient Encounter Medications as of 10/15/2019  Medication Sig  . ALPRAZolam (XANAX) 0.25 MG tablet Half to one tab tab as needed for panic attacks  . Meth-Hyo-M Bl-Na Phos-Ph Sal (URIBEL) 118 MG CAPS Take 1 capsule (118 mg total) by mouth 4 (four) times daily as needed.  . triamcinolone (KENALOG) 0.025 % cream Apply 1 application topically 2 (two) times daily.  . [DISCONTINUED] sulfamethoxazole-trimethoprim (BACTRIM DS) 800-160 MG tablet Take 1 tablet by mouth 2 (two) times daily. (Patient not taking: Reported on 10/15/2019)   No facility-administered encounter medications on file as of 10/15/2019.    Surgical History: History reviewed. No pertinent surgical history.  Medical History: Past Medical History:  Diagnosis Date  . GAD (generalized anxiety disorder)     Family History: Family History  Problem Relation Age of Onset  . Arthritis/Rheumatoid Mother   . Anxiety disorder Brother   . Parkinson's disease  Maternal Grandfather   . Dementia Maternal Grandfather     Social History   Socioeconomic History  . Marital status: Married    Spouse name: Not on file  . Number of children: Not on file  . Years of education: Not on file  . Highest education level: Not on file  Occupational History  . Not on file  Tobacco Use  . Smoking status: Current Some Day Smoker    Types: Cigarettes  . Smokeless tobacco: Never Used  Substance and Sexual Activity  . Alcohol use: Never  . Drug use: Never  . Sexual activity: Yes    Birth control/protection: I.U.D.    Comment: Mirena  Other Topics Concern  . Not on file  Social History Narrative  . Not on file   Social Determinants of Health   Financial Resource Strain:   . Difficulty of Paying Living Expenses: Not on file  Food Insecurity:   . Worried About Charity fundraiser in the Last Year: Not on file  . Ran Out of Food in the Last Year: Not on file  Transportation Needs:   . Lack of Transportation (Medical): Not on file  . Lack of Transportation (Non-Medical): Not on file  Physical Activity:   . Days of Exercise per Week: Not on file  . Minutes of Exercise per Session: Not on file  Stress:   . Feeling of Stress : Not on file  Social Connections:   . Frequency of Communication with Friends and Family: Not on file  . Frequency of  Social Gatherings with Friends and Family: Not on file  . Attends Religious Services: Not on file  . Active Member of Clubs or Organizations: Not on file  . Attends Archivist Meetings: Not on file  . Marital Status: Not on file  Intimate Partner Violence:   . Fear of Current or Ex-Partner: Not on file  . Emotionally Abused: Not on file  . Physically Abused: Not on file  . Sexually Abused: Not on file      Review of Systems  Constitutional: Negative for activity change, chills, diaphoresis and fatigue.  HENT: Negative for ear pain, postnasal drip and sinus pressure.   Respiratory: Negative for  cough, shortness of breath and wheezing.   Cardiovascular: Negative for chest pain, palpitations and leg swelling.  Gastrointestinal: Positive for abdominal distention and constipation. Negative for abdominal pain, diarrhea, nausea and vomiting.  Endocrine: Negative for cold intolerance, heat intolerance, polydipsia and polyuria.  Genitourinary: Negative for decreased urine volume, difficulty urinating, dysuria, flank pain, frequency, pelvic pain and urgency.       Urinary symptoms have resolved since her last visit.   Musculoskeletal: Negative for arthralgias, back pain, gait problem and neck pain.  Skin: Negative for color change.  Allergic/Immunologic: Negative for environmental allergies and food allergies.  Neurological: Negative for dizziness and headaches.  Hematological: Does not bruise/bleed easily.  Psychiatric/Behavioral: Negative for agitation, behavioral problems (depression) and hallucinations. The patient is nervous/anxious.     Today's Vitals   10/15/19 1515  BP: 105/82  Pulse: 74  Resp: 16  Temp: 97.9 F (36.6 C)  SpO2: 99%  Weight: 165 lb (74.8 kg)  Height: 5\' 1"  (1.549 m)   Body mass index is 31.18 kg/m.  Physical Exam Vitals and nursing note reviewed.  Constitutional:      General: She is not in acute distress.    Appearance: Normal appearance. She is well-developed. She is not diaphoretic.  HENT:     Head: Normocephalic and atraumatic.     Nose: Nose normal.     Mouth/Throat:     Pharynx: No oropharyngeal exudate.  Eyes:     Pupils: Pupils are equal, round, and reactive to light.  Neck:     Thyroid: No thyromegaly.     Vascular: No JVD.     Trachea: No tracheal deviation.  Cardiovascular:     Rate and Rhythm: Normal rate and regular rhythm.     Heart sounds: Normal heart sounds. No murmur. No friction rub. No gallop.   Pulmonary:     Effort: Pulmonary effort is normal. No respiratory distress.     Breath sounds: Normal breath sounds. No wheezing  or rales.  Chest:     Chest wall: No tenderness.  Abdominal:     General: Bowel sounds are normal.     Palpations: Abdomen is soft.     Tenderness: There is no abdominal tenderness.  Musculoskeletal:        General: Normal range of motion.     Cervical back: Normal range of motion and neck supple.  Lymphadenopathy:     Cervical: No cervical adenopathy.  Skin:    General: Skin is warm and dry.  Neurological:     Mental Status: She is alert and oriented to person, place, and time.     Cranial Nerves: No cranial nerve deficit.  Psychiatric:        Attention and Perception: Attention and perception normal.        Mood and Affect: Affect normal.  Mood is anxious.        Speech: Speech normal.        Behavior: Behavior normal. Behavior is cooperative.        Thought Content: Thought content normal.        Cognition and Memory: Cognition and memory normal.        Judgment: Judgment normal.   Assessment/Plan: 1. Bladder spasms Have resolved since her last visit. Reviewed ultrasound results of kidneys and bladder. There was small amount of postvoid residual urine in the bladder. Was otherwise unremarkable. Advised patient to keep new patient appointment with urology, but she may not keep this if symptoms remain negative.   2. Pelvic pain Negative ultrasound of pelvic organs.   3. Irritable bowel syndrome with constipation Discussed importance of proper amounts of fiber and water in the diet every day. Suggested she try pre and probiotics daily to help balance bacteria in the gut. Will continue to monitor closely.    General Counseling: Mieka verbalizes understanding of the findings of todays visit and agrees with plan of treatment. I have discussed any further diagnostic evaluation that may be needed or ordered today. We also reviewed her medications today. she has been encouraged to call the office with any questions or concerns that should arise related to todays visit.   This patient  was seen by Leretha Pol FNP Collaboration with Dr Lavera Guise as a part of collaborative care agreement  Total time spent: 30 Minutes   Time spent includes review of chart, medications, test results, and follow up plan with the patient.      Dr Lavera Guise Internal medicine

## 2019-10-21 ENCOUNTER — Ambulatory Visit: Payer: Self-pay | Admitting: Urology

## 2020-01-14 ENCOUNTER — Telehealth: Payer: Self-pay

## 2020-01-14 NOTE — Telephone Encounter (Signed)
Lmom to confirm and screen for 01-16-20 ov.

## 2020-01-16 ENCOUNTER — Encounter: Payer: 59 | Admitting: Nurse Practitioner

## 2020-02-19 ENCOUNTER — Telehealth: Payer: Self-pay

## 2020-02-19 NOTE — Telephone Encounter (Signed)
Confirmed and screened for 02-21-20 ov.

## 2020-02-21 ENCOUNTER — Encounter: Payer: Self-pay | Admitting: Nurse Practitioner

## 2020-02-21 ENCOUNTER — Ambulatory Visit (INDEPENDENT_AMBULATORY_CARE_PROVIDER_SITE_OTHER): Payer: 59 | Admitting: Nurse Practitioner

## 2020-02-21 ENCOUNTER — Other Ambulatory Visit: Payer: Self-pay

## 2020-02-21 VITALS — BP 101/59 | HR 80 | Temp 97.8°F | Resp 16 | Ht 61.0 in | Wt 163.4 lb

## 2020-02-21 DIAGNOSIS — Z0001 Encounter for general adult medical examination with abnormal findings: Secondary | ICD-10-CM | POA: Diagnosis not present

## 2020-02-21 DIAGNOSIS — R300 Dysuria: Secondary | ICD-10-CM | POA: Diagnosis not present

## 2020-02-21 DIAGNOSIS — F411 Generalized anxiety disorder: Secondary | ICD-10-CM | POA: Diagnosis not present

## 2020-02-21 DIAGNOSIS — R5383 Other fatigue: Secondary | ICD-10-CM

## 2020-02-21 NOTE — Progress Notes (Signed)
[facility] [place], [place] [address]  Internal MEDICINE  Office Visit Note  Patient Name: Jamie Kennedy  [id number]  [id number]  Date of Service: 03/01/2020  Chief Complaint  Patient presents with  . Annual Exam  . Anxiety  . Quality Metric Gaps    TDAP     The patient is here for health maintenance exam. She states that she is doing well. Her bladder spasms have improved. Not really having them any longer. She also believes her anxiety to be well managed. Her last pap smear was done 01/2019 and was normal. Labs were also done around the same time. She had mild vitamin d deficiency with other labs being normal.   Pt is here for routine health maintenance examination  Current Medication: Outpatient Encounter Medications as of 02/21/2020  Medication Sig  . ALPRAZolam (XANAX) 0.25 MG tablet Half to one tab tab as needed for panic attacks  . [DISCONTINUED] Meth-Hyo-M Bl-Na Phos-Ph Sal (URIBEL) 118 MG CAPS Take 1 capsule (118 mg total) by mouth 4 (four) times daily as needed.  . [DISCONTINUED] triamcinolone (KENALOG) 0.025 % cream Apply 1 application topically 2 (two) times daily.   No facility-administered encounter medications on file as of 02/21/2020.    Surgical History: History reviewed. No pertinent surgical history.  Medical History: Past Medical History:  Diagnosis Date  . GAD (generalized anxiety disorder)     Family History: Family History  Problem Relation Age of Onset  . Arthritis/Rheumatoid Mother   . Anxiety disorder Brother   . Parkinson's disease Maternal Grandfather   . Dementia Maternal Grandfather       Review of Systems  Constitutional: Negative for activity change, chills, diaphoresis and fatigue.  HENT: Negative for ear pain, postnasal drip and sinus pressure.   Respiratory: Negative for cough, shortness of breath and wheezing.   Cardiovascular: Negative for chest pain, palpitations and leg swelling.   Gastrointestinal: Negative for abdominal distention, abdominal pain, constipation, diarrhea, nausea and vomiting.  Endocrine: Negative for cold intolerance, heat intolerance, polydipsia and polyuria.  Genitourinary: Negative for decreased urine volume, difficulty urinating, dysuria, flank pain, frequency, pelvic pain and urgency.       No further urinary symptoms reported by patient.   Musculoskeletal: Negative for arthralgias, back pain, gait problem and neck pain.  Skin: Negative for color change.  Allergic/Immunologic: Negative for environmental allergies and food allergies.  Neurological: Negative for dizziness and headaches.  Hematological: Does not bruise/bleed easily.  Psychiatric/Behavioral: Negative for agitation, behavioral problems (depression) and hallucinations. The patient is nervous/anxious.      Today's Vitals   02/21/20 1539  BP: (!) 101/59  Pulse: 80  Resp: 16  Temp: 97.8 F (36.6 C)  SpO2: 97%  Weight: 163 lb 6.4 oz (74.1 kg)  Height: 5\' 1"  (1.549 m)   Body mass index is 30.87 kg/m.  Physical Exam Vitals and nursing note reviewed.  Constitutional:      General: She is not in acute distress.    Appearance: Normal appearance. She is well-developed. She is not diaphoretic.  HENT:     Head: Normocephalic and atraumatic.     Nose: Nose normal.     Mouth/Throat:     Pharynx: No oropharyngeal exudate.  Eyes:     Pupils: Pupils are equal, round, and reactive to light.  Neck:     Thyroid: No thyromegaly.     Vascular: No JVD.     Trachea: No tracheal deviation.  Cardiovascular:  Rate and Rhythm: Normal rate and regular rhythm.     Pulses: Normal pulses.     Heart sounds: Normal heart sounds. No murmur heard.  No friction rub. No gallop.   Pulmonary:     Effort: Pulmonary effort is normal. No respiratory distress.     Breath sounds: Normal breath sounds. No wheezing or rales.  Chest:     Chest wall: No tenderness.     Breasts:        Right: Normal.  No swelling, bleeding, inverted nipple, mass, nipple discharge, skin change or tenderness.        Left: Normal. No swelling, bleeding, inverted nipple, mass, nipple discharge, skin change or tenderness.  Abdominal:     General: Bowel sounds are normal.     Palpations: Abdomen is soft.     Tenderness: There is no abdominal tenderness.  Musculoskeletal:        General: Normal range of motion.     Cervical back: Normal range of motion and neck supple.  Lymphadenopathy:     Cervical: No cervical adenopathy.     Upper Body:     Right upper body: No axillary adenopathy.     Left upper body: No axillary adenopathy.  Skin:    General: Skin is warm and dry.  Neurological:     General: No focal deficit present.     Mental Status: She is alert and oriented to person, place, and time.     Cranial Nerves: No cranial nerve deficit.  Psychiatric:        Mood and Affect: Mood normal.        Behavior: Behavior normal.        Thought Content: Thought content normal.        Judgment: Judgment normal.      LABS: Recent Results (from the past 2160 hour(s))  Urinalysis, Routine w reflex microscopic     Status: None   Collection Time: 02/21/20  3:41 PM  Result Value Ref Range   Specific Gravity, UA 1.015 1.005 - 1.030   pH, UA 5.5 5.0 - 7.5   Color, UA Yellow Yellow   Appearance Ur Clear Clear   Leukocytes,UA Negative Negative   Protein,UA Negative Negative/Trace   Glucose, UA Negative Negative   Ketones, UA Negative Negative   RBC, UA Negative Negative   Bilirubin, UA Negative Negative   Urobilinogen, Ur 0.2 0.2 - 1.0 mg/dL   Nitrite, UA Negative Negative   Microscopic Examination Comment     Comment: Microscopic not indicated and not performed.    Assessment/Plan: 1. Encounter for general adult medical examination with abnormal findings Annual health maintenance exam today.   2. Other fatigue Will check labs including thyroid and iron panels for further evaluation.   3. GAD  (generalized anxiety disorder) May take alprazolam as needed and as prescribed.   4. Dysuria - Urinalysis, Routine w reflex microscopic  General Counseling: Jamie Kennedy verbalizes understanding of the findings of todays visit and agrees with plan of treatment. I have discussed any further diagnostic evaluation that may be needed or ordered today. We also reviewed her medications today. she has been encouraged to call the office with any questions or concerns that should arise related to todays visit.    Counseling:  This patient was seen by Leretha Pol FNP Collaboration with Dr Lavera Guise as a part of collaborative care agreement  Orders Placed This Encounter  Procedures  . Urinalysis, Routine w reflex microscopic  Total time spent: 30 Minutes  Time spent includes review of chart, medications, test results, and follow up plan with the patient.     Lavera Guise, MD  Internal Medicine

## 2020-02-22 LAB — URINALYSIS, ROUTINE W REFLEX MICROSCOPIC
Bilirubin, UA: NEGATIVE
Glucose, UA: NEGATIVE
Ketones, UA: NEGATIVE
Leukocytes,UA: NEGATIVE
Nitrite, UA: NEGATIVE
Protein,UA: NEGATIVE
RBC, UA: NEGATIVE
Specific Gravity, UA: 1.015 (ref 1.005–1.030)
Urobilinogen, Ur: 0.2 mg/dL (ref 0.2–1.0)
pH, UA: 5.5 (ref 5.0–7.5)

## 2020-03-01 DIAGNOSIS — R5383 Other fatigue: Secondary | ICD-10-CM | POA: Insufficient documentation

## 2020-04-10 ENCOUNTER — Ambulatory Visit: Payer: 59 | Admitting: Nurse Practitioner

## 2020-04-13 ENCOUNTER — Other Ambulatory Visit: Payer: Self-pay | Admitting: Nurse Practitioner

## 2020-04-14 LAB — LIPID PANEL WITH LDL/HDL RATIO
Cholesterol, Total: 138 mg/dL (ref 100–199)
HDL: 42 mg/dL (ref >39)
LDL Chol Calc (NIH): 80 mg/dL (ref 0–99)
LDL/HDL Ratio: 1.9 ratio (ref 0.0–3.2)
Triglycerides: 81 mg/dL (ref 0–149)
VLDL Cholesterol Cal: 16 mg/dL (ref 5–40)

## 2020-04-14 LAB — COMPREHENSIVE METABOLIC PANEL
ALT: 14 IU/L (ref 0–32)
AST: 11 IU/L (ref 0–40)
Albumin/Globulin Ratio: 2 (ref 1.2–2.2)
Albumin: 4.2 g/dL (ref 3.8–4.8)
Alkaline Phosphatase: 67 IU/L (ref 48–121)
BUN/Creatinine Ratio: 12 (ref 9–23)
BUN: 9 mg/dL (ref 6–20)
Bilirubin Total: 0.4 mg/dL (ref 0.0–1.2)
CO2: 22 mmol/L (ref 20–29)
Calcium: 8.9 mg/dL (ref 8.7–10.2)
Chloride: 106 mmol/L (ref 96–106)
Creatinine, Ser: 0.73 mg/dL (ref 0.57–1.00)
GFR calc Af Amer: 124 mL/min/{1.73_m2} (ref >59)
GFR calc non Af Amer: 108 mL/min/{1.73_m2} (ref >59)
Globulin, Total: 2.1 g/dL (ref 1.5–4.5)
Glucose: 95 mg/dL (ref 65–99)
Potassium: 4.1 mmol/L (ref 3.5–5.2)
Sodium: 140 mmol/L (ref 134–144)
Total Protein: 6.3 g/dL (ref 6.0–8.5)

## 2020-04-14 LAB — CBC
Hematocrit: 37.1 % (ref 34.0–46.6)
Hemoglobin: 13.1 g/dL (ref 11.1–15.9)
MCH: 31 pg (ref 26.6–33.0)
MCHC: 35.3 g/dL (ref 31.5–35.7)
MCV: 88 fL (ref 79–97)
Platelets: 229 10*3/uL (ref 150–450)
RBC: 4.23 x10E6/uL (ref 3.77–5.28)
RDW: 12 % (ref 11.7–15.4)
WBC: 8.1 10*3/uL (ref 3.4–10.8)

## 2020-04-14 LAB — T4, FREE: Free T4: 1.27 ng/dL (ref 0.82–1.77)

## 2020-04-14 LAB — VITAMIN D 25 HYDROXY (VIT D DEFICIENCY, FRACTURES): Vit D, 25-Hydroxy: 21.7 ng/mL — ABNORMAL LOW (ref 30.0–100.0)

## 2020-04-14 LAB — TSH: TSH: 1.62 u[IU]/mL (ref 0.450–4.500)

## 2020-04-16 ENCOUNTER — Telehealth: Payer: Self-pay

## 2020-04-16 NOTE — Telephone Encounter (Signed)
Called pt and left VM asking to return call.  Tried again later and still no answer.  Called pt's husbands cell # and not able to leave VM.

## 2020-04-21 ENCOUNTER — Ambulatory Visit: Payer: 59 | Admitting: Internal Medicine

## 2020-04-21 ENCOUNTER — Encounter: Payer: Self-pay | Admitting: Internal Medicine

## 2020-04-21 ENCOUNTER — Other Ambulatory Visit: Payer: Self-pay

## 2020-04-21 VITALS — BP 120/84 | HR 91 | Temp 97.7°F | Resp 16 | Ht 61.0 in | Wt 163.4 lb

## 2020-04-21 DIAGNOSIS — G510 Bell's palsy: Secondary | ICD-10-CM

## 2020-04-21 DIAGNOSIS — J300 Vasomotor rhinitis: Secondary | ICD-10-CM | POA: Diagnosis not present

## 2020-04-21 MED ORDER — FAMCICLOVIR 500 MG PO TABS: 500.0000 mg | ORAL_TABLET | Freq: Two times a day (BID) | ORAL | 0 refills | Status: DC

## 2020-04-21 MED ORDER — PREDNISONE 10 MG PO TABS: ORAL_TABLET | ORAL | 0 refills | Status: DC

## 2020-04-21 NOTE — Progress Notes (Signed)
[facility] [place], [place] [address]  Internal MEDICINE  Office Visit Note  Patient Name: Jamie Kennedy  [id number]  [id number]  Date of Service: 04/23/2020  Chief Complaint  Patient presents with  . Sinusitis    started 3 weeks ago, better 1 week ago  . Ear Pain    right ear  . Dizziness    comes and goes  . Jaw Pain    right jaw  . mouth numbness    right side 1 1/2 weeks ago.   seen a denist on same day it started, xrays didn t show anything, comes and goes  . Quality Metric Gaps    Tdap, flu vaccine, HIV screen    HPI Pt is here with c/o Right sided facial numbness, she was sick about a week ago  With sinus congestion, took OTC meds and felt better. However since yesterday she started noticed these symptoms. Upon questions she does admit to have problem with chewing food and drooling. No problem closing her eyes, denies any rash, fever ot chills. She is RN in [employer]  Current Medication: Outpatient Encounter Medications as of 04/21/2020  Medication Sig  . ALPRAZolam (XANAX) 0.25 MG tablet Half to one tab tab as needed for panic attacks  . famciclovir (FAMVIR) 500 MG tablet Take 1 tablet (500 mg total) by mouth 2 (two) times daily.  . predniSONE (DELTASONE) 10 MG tablet Take one tab 3 x day for 3 days, then take one tab 2 x a day for 3 days and then take one tab a day for 3 days for bell's palsy   No facility-administered encounter medications on file as of 04/21/2020.    Surgical History: History reviewed. No pertinent surgical history.  Medical History: Past Medical History:  Diagnosis Date  . GAD (generalized anxiety disorder)     Family History: Family History  Problem Relation Age of Onset  . Arthritis/Rheumatoid Mother   . Anxiety disorder Brother   . Parkinson's disease Maternal Grandfather   . Dementia Maternal Grandfather     Social History   Socioeconomic History  . Marital status: Married    Spouse name: Not on file  .  Number of children: Not on file  . Years of education: Not on file  . Highest education level: Not on file  Occupational History  . Not on file  Tobacco Use  . Smoking status: Former Smoker    Types: Cigarettes    Quit date: 04/07/2020    Years since quitting: 0.0  . Smokeless tobacco: Current User  . Tobacco comment: smoke e-cig daily  Vaping Use  . Vaping Use: Never used  Substance and Sexual Activity  . Alcohol use: Never  . Drug use: Never  . Sexual activity: Yes    Birth control/protection: I.U.D.    Comment: Mirena  Other Topics Concern  . Not on file  Social History Narrative  . Not on file   Social Determinants of Health   Financial Resource Strain:   . Difficulty of Paying Living Expenses: Not on file  Food Insecurity:   . Worried About Charity fundraiser in the Last Year: Not on file  . Ran Out of Food in the Last Year: Not on file  Transportation Needs:   . Lack of Transportation (Medical): Not on file  . Lack of Transportation (Non-Medical): Not on file  Physical Activity:   . Days of Exercise per Week: Not on file  . Minutes  of Exercise per Session: Not on file  Stress:   . Feeling of Stress : Not on file  Social Connections:   . Frequency of Communication with Friends and Family: Not on file  . Frequency of Social Gatherings with Friends and Family: Not on file  . Attends Religious Services: Not on file  . Active Member of Clubs or Organizations: Not on file  . Attends Archivist Meetings: Not on file  . Marital Status: Not on file  Intimate Partner Violence:   . Fear of Current or Ex-Partner: Not on file  . Emotionally Abused: Not on file  . Physically Abused: Not on file  . Sexually Abused: Not on file      Review of Systems  Constitutional: Negative.   HENT: Positive for congestion, sinus pain and sore throat.   Respiratory: Negative.   Cardiovascular: Negative.  Negative for palpitations and leg swelling.  Allergic/Immunologic:  Negative.   Neurological: Positive for facial asymmetry.    Vital Signs: BP 120/84   Pulse 91   Temp 97.7 F (36.5 C)   Resp 16   Ht 5\' 1"  (1.549 m)   Wt 163 lb 6.4 oz (74.1 kg)   SpO2 98%   BMI 30.87 kg/m    Physical Exam Constitutional:      Appearance: Normal appearance.     Comments: Pleasant   HENT:     Head: Normocephalic and atraumatic.     Right Ear: Tympanic membrane normal.     Left Ear: Tympanic membrane normal.     Nose: Nose normal.     Mouth/Throat:     Mouth: Mucous membranes are moist.  Eyes:     Comments: Right eye brow is lower than right Flattening of nasolabial fold on the right Facial assymmetry is noted   Cardiovascular:     Rate and Rhythm: Normal rate and regular rhythm.  Pulmonary:     Effort: Pulmonary effort is normal.     Breath sounds: Normal breath sounds.  Musculoskeletal:     Cervical back: Normal range of motion and neck supple.  Skin:    General: Skin is warm and dry.     Coloration: Skin is not pale.     Findings: No rash.  Neurological:     Mental Status: She is alert.    Assessment/Plan: 1. Bell's palsy Pt seems to have a mild case of Bell's palsy, will monitor, if continues to have symptoms of numbness, might need MRI - famciclovir (FAMVIR) 500 MG tablet; Take 1 tablet (500 mg total) by mouth 2 (two) times daily.  Dispense: 10 tablet; Refill: 0 - predniSONE (DELTASONE) 10 MG tablet; Take one tab 3 x day for 3 days, then take one tab 2 x a day for 3 days and then take one tab a day for 3 days for bell's palsy  Dispense: 18 tablet; Refill: 0  2. Vasomotor rhinitis Viral in nature, improved, could have lead to Bell's Palsy   General Counseling: [name] verbalizes understanding of the findings of todays visit and agrees with plan of treatment. I have discussed any further diagnostic evaluation that may be needed or ordered today. We also reviewed her medications today. she has been encouraged to call the office with any questions  or concerns that should arise related to todays visit.   Meds ordered this encounter  Medications  . famciclovir (FAMVIR) 500 MG tablet    Sig: Take 1 tablet (500 mg total) by mouth 2 (two) times  daily.    Dispense:  10 tablet    Refill:  0  . predniSONE (DELTASONE) 10 MG tablet    Sig: Take one tab 3 x day for 3 days, then take one tab 2 x a day for 3 days and then take one tab a day for 3 days for bell's palsy    Dispense:  18 tablet    Refill:  0    Total time spent: 30 Minutes Time spent includes review of chart, medications, test results, and follow up plan with the patient.      Dr Lavera Guise Internal medicine

## 2020-04-22 ENCOUNTER — Telehealth: Payer: Self-pay

## 2020-04-22 NOTE — Telephone Encounter (Signed)
I called patient and checking on how she was feeling and to see if she wants to schedule appointment tomorrow. I left a message. Jamie Kennedy

## 2020-04-22 NOTE — Telephone Encounter (Signed)
Pt called and advised that she was having elevated heart rate after taking prednisone and famciclovir.  She advised she was on the way to the ER.  I advised DFK of what was happening to pt and she spoke to her and advised her that she could stop the medication and go back home and relax rest of day.  We have an appt scheduled for her on 04/23/20 if she feels she needs to be seen.  Otherwise DFK advised that if she didn't feel comfortable that she could go back to ER.

## 2020-05-01 ENCOUNTER — Telehealth: Payer: Self-pay

## 2020-05-01 NOTE — Telephone Encounter (Signed)
Confirmed and screened for 05-05-20 ov. 

## 2020-05-05 ENCOUNTER — Ambulatory Visit: Payer: 59 | Admitting: Hospice and Palliative Medicine

## 2020-05-05 ENCOUNTER — Encounter: Payer: Self-pay | Admitting: Hospice and Palliative Medicine

## 2020-05-05 ENCOUNTER — Other Ambulatory Visit: Payer: Self-pay

## 2020-05-05 DIAGNOSIS — G510 Bell's palsy: Secondary | ICD-10-CM | POA: Diagnosis not present

## 2020-05-05 DIAGNOSIS — F411 Generalized anxiety disorder: Secondary | ICD-10-CM

## 2020-05-05 NOTE — Progress Notes (Signed)
[facility] [place], [place] [address]  Internal MEDICINE  Office Visit Note  Patient Name: Jamie Kennedy  [id number]  [id number]  Date of Service: 05/05/2020  Chief Complaint  Patient presents with  . Follow-up    HPI Patient is here for routine follow-up She was last seen in the office 9/7 and diagnosed with Bells Palsy, treated with prednisone as well as famciclovir, she was only able to take one dose of each medication due to negative side effects such as elevated heart rate as well as anxiousness Since her visit two weeks ago even without treatment she says her symptoms had greatly improved On Sunday, 9/19 she started having symptoms again, consisting of tingling and numbness to right side of face, noticed that the right side of her face was also a bit swollen, and mild drooling  She is concerned today that her symptoms have returned, she wants to know what is going on and would like to seek further treatment  Current Medication: Outpatient Encounter Medications as of 05/05/2020  Medication Sig  . ALPRAZolam (XANAX) 0.25 MG tablet Half to one tab tab as needed for panic attacks  . famciclovir (FAMVIR) 500 MG tablet Take 1 tablet (500 mg total) by mouth 2 (two) times daily.  . predniSONE (DELTASONE) 10 MG tablet Take one tab 3 x day for 3 days, then take one tab 2 x a day for 3 days and then take one tab a day for 3 days for bell's palsy   No facility-administered encounter medications on file as of 05/05/2020.    Surgical History: History reviewed. No pertinent surgical history.  Medical History: Past Medical History:  Diagnosis Date  . GAD (generalized anxiety disorder)     Family History: Family History  Problem Relation Age of Onset  . Arthritis/Rheumatoid Mother   . Anxiety disorder Brother   . Parkinson's disease Maternal Grandfather   . Dementia Maternal Grandfather     Social History   Socioeconomic History  . Marital status:  Married    Spouse name: Not on file  . Number of children: Not on file  . Years of education: Not on file  . Highest education level: Not on file  Occupational History  . Not on file  Tobacco Use  . Smoking status: Former Smoker    Types: Cigarettes    Quit date: 04/07/2020    Years since quitting: 0.0  . Smokeless tobacco: Never Used  . Tobacco comment: smoke e-cig daily  Vaping Use  . Vaping Use: Never used  Substance and Sexual Activity  . Alcohol use: Never  . Drug use: Never  . Sexual activity: Yes    Birth control/protection: I.U.D.    Comment: Mirena  Other Topics Concern  . Not on file  Social History Narrative  . Not on file   Social Determinants of Health   Financial Resource Strain:   . Difficulty of Paying Living Expenses: Not on file  Food Insecurity:   . Worried About Charity fundraiser in the Last Year: Not on file  . Ran Out of Food in the Last Year: Not on file  Transportation Needs:   . Lack of Transportation (Medical): Not on file  . Lack of Transportation (Non-Medical): Not on file  Physical Activity:   . Days of Exercise per Week: Not on file  . Minutes of Exercise per Session: Not on file  Stress:   . Feeling of Stress : Not on file  Social Connections:   . Frequency of Communication with Friends and Family: Not on file  . Frequency of Social Gatherings with Friends and Family: Not on file  . Attends Religious Services: Not on file  . Active Member of Clubs or Organizations: Not on file  . Attends Archivist Meetings: Not on file  . Marital Status: Not on file  Intimate Partner Violence:   . Fear of Current or Ex-Partner: Not on file  . Emotionally Abused: Not on file  . Physically Abused: Not on file  . Sexually Abused: Not on file   Review of Systems  Constitutional: Negative for chills, diaphoresis and fatigue.  HENT: Negative for ear pain, postnasal drip and sinus pressure.   Eyes: Negative for photophobia, discharge,  redness, itching and visual disturbance.  Respiratory: Negative for cough, shortness of breath and wheezing.   Cardiovascular: Negative for chest pain, palpitations and leg swelling.  Gastrointestinal: Negative for abdominal pain, constipation, diarrhea, nausea and vomiting.  Genitourinary: Negative for dysuria and flank pain.  Musculoskeletal: Negative for arthralgias, back pain, gait problem and neck pain.  Skin: Negative for color change.  Allergic/Immunologic: Negative for environmental allergies and food allergies.  Neurological: Negative for dizziness and headaches.       Right sided facial numbness and tingling, swelling to right side of face as well as mild drooling  Hematological: Does not bruise/bleed easily.  Psychiatric/Behavioral: Negative for agitation, behavioral problems (depression) and hallucinations.    Vital Signs: BP 118/74   Pulse 75   Temp (!) 97.5 F (36.4 C)   Resp 16   Ht 5\' 1"  (1.549 m)   Wt 167 lb 12.8 oz (76.1 kg)   SpO2 99%   BMI 31.71 kg/m    Physical Exam Vitals reviewed.  Constitutional:      Appearance: Normal appearance. She is normal weight.  Cardiovascular:     Rate and Rhythm: Normal rate and regular rhythm.     Pulses: Normal pulses.     Heart sounds: Normal heart sounds.  Pulmonary:     Effort: Pulmonary effort is normal.     Breath sounds: Normal breath sounds.  Abdominal:     General: Abdomen is flat.     Palpations: Abdomen is soft.  Musculoskeletal:        General: Normal range of motion.     Cervical back: Normal range of motion.  Skin:    General: Skin is warm.  Neurological:     Mental Status: She is alert and oriented to person, place, and time. Mental status is at baseline.     Cranial Nerves: Dysarthria and facial asymmetry present.     Comments: Right sided numbness, slight slurred speech, asymmetrical facial movements  Psychiatric:        Mood and Affect: Mood normal.        Behavior: Behavior normal.         Thought Content: Thought content normal.    Assessment/Plan: 1. Bell's palsy Due to return of symptoms will assess MRI of brain. Will also assess B12 and Ferritin levels. Will adjust plan of care based on results of testing. - MR Brain W Wo Contrast; Future - B12 - Ferritin  2. GAD (generalized anxiety disorder) She feels more overwhelmed as her symptoms have returned. She is also nervous of having MRI testing as she has never had this. Encouraged her to focus on stress relief strategies, avoid Internet searching for her symptoms and improve her sleep. Advised she may  take her as needed alprazolam prior to MRI testing.   General Counseling: Laiylah verbalizes understanding of the findings of todays visit and agrees with plan of treatment. I have discussed any further diagnostic evaluation that may be needed or ordered today. We also reviewed her medications today. she has been encouraged to call the office with any questions or concerns that should arise related to todays visit.    Orders Placed This Encounter  Procedures  . MR Brain W Wo Contrast  . B12  . Ferritin   Time spent: 30 Minutes Time spent includes review of chart, medications, test results and follow-up plan with the patient.  This patient was seen by Theodoro Grist AGNP-C in Collaboration with Dr Lavera Guise as a part of collaborative care agreement     Tanna Furry. Sherryann Frese AGNP-C Internal medicine

## 2020-05-13 ENCOUNTER — Ambulatory Visit
Admission: RE | Admit: 2020-05-13 | Discharge: 2020-05-13 | Disposition: A | Discharge status: Home / Self Care | Payer: 59 | Source: Ambulatory Visit | Attending: Hospice and Palliative Medicine | Admitting: Hospice and Palliative Medicine

## 2020-05-13 ENCOUNTER — Other Ambulatory Visit: Payer: Self-pay

## 2020-05-13 DIAGNOSIS — G510 Bell's palsy: Secondary | ICD-10-CM | POA: Insufficient documentation

## 2020-05-13 MED ORDER — GADOBUTROL 1 MMOL/ML IV SOLN
7.5000 mL | Freq: Once | INTRAVENOUS | Status: AC | PRN
  Administered 2020-05-13: 7.5 mL via INTRAVENOUS

## 2020-06-02 LAB — FERRITIN: Ferritin: 62 ng/mL (ref 15–150)

## 2020-06-02 LAB — VITAMIN B12: Vitamin B-12: 401 pg/mL (ref 232–1245)

## 2020-06-03 ENCOUNTER — Other Ambulatory Visit: Payer: Self-pay

## 2020-06-03 ENCOUNTER — Ambulatory Visit (INDEPENDENT_AMBULATORY_CARE_PROVIDER_SITE_OTHER): Payer: 59 | Admitting: Hospice and Palliative Medicine

## 2020-06-03 ENCOUNTER — Encounter: Payer: Self-pay | Admitting: Hospice and Palliative Medicine

## 2020-06-03 VITALS — BP 110/86 | HR 78 | Temp 98.1°F | Resp 16 | Ht 61.0 in | Wt 166.6 lb

## 2020-06-03 DIAGNOSIS — F411 Generalized anxiety disorder: Secondary | ICD-10-CM

## 2020-06-03 DIAGNOSIS — G510 Bell's palsy: Secondary | ICD-10-CM

## 2020-06-03 DIAGNOSIS — Z23 Encounter for immunization: Secondary | ICD-10-CM | POA: Diagnosis not present

## 2020-06-03 MED ORDER — ALPRAZOLAM 0.25 MG PO TABS: ORAL_TABLET | ORAL | 0 refills | Status: DC

## 2020-06-03 NOTE — Progress Notes (Signed)
[facility] [place], [place] [address]  Internal MEDICINE  Office Visit Note  Patient Name: Jamie Kennedy  [id number]  [id number]  Date of Service: 06/06/2020  Chief Complaint  Patient presents with  . Follow-up    review MRI, bell's palsy symptoms  . Anxiety  . policy update form  . Quality Metric Gaps    flu shot, TDAP    HPI Patient is here for routine follow-up We have been following her for acute symptoms of Bells Palsy Original outbreak 09/07-unable to tolerate treatment--Famvir and Prednisone Since initial outbreak--symptoms continue to fluctuate, will improve for several days but then symptoms will return Symptoms consist of right sided facial pain and numbness, right eye watering, drooling as well as right sided facial asymmetry Due to ongoing symptoms we proceeded with MRI brain Reviewed results--normal MRI She does report that symptoms are somewhat resolved with alprazolam, also feels this helps manage her anxiety related to ongoing symptoms  Current Medication: Outpatient Encounter Medications as of 06/03/2020  Medication Sig  . ALPRAZolam (XANAX) 0.25 MG tablet Half to one tab tab as needed for panic attacks  . [DISCONTINUED] ALPRAZolam (XANAX) 0.25 MG tablet Half to one tab tab as needed for panic attacks  . famciclovir (FAMVIR) 500 MG tablet Take 1 tablet (500 mg total) by mouth 2 (two) times daily.  . predniSONE (DELTASONE) 10 MG tablet Take one tab 3 x day for 3 days, then take one tab 2 x a day for 3 days and then take one tab a day for 3 days for bell's palsy   No facility-administered encounter medications on file as of 06/03/2020.    Surgical History: History reviewed. No pertinent surgical history.  Medical History: Past Medical History:  Diagnosis Date  . GAD (generalized anxiety disorder)     Family History: Family History  Problem Relation Age of Onset  . Arthritis/Rheumatoid Mother   . Anxiety disorder Brother    . Parkinson's disease Maternal Grandfather   . Dementia Maternal Grandfather     Social History   Socioeconomic History  . Marital status: Married    Spouse name: Not on file  . Number of children: Not on file  . Years of education: Not on file  . Highest education level: Not on file  Occupational History  . Not on file  Tobacco Use  . Smoking status: Former Smoker    Types: Cigarettes    Quit date: 04/07/2020    Years since quitting: 0.1  . Smokeless tobacco: Never Used  . Tobacco comment: smoke e-cig daily  Vaping Use  . Vaping Use: Never used  Substance and Sexual Activity  . Alcohol use: Never  . Drug use: Never  . Sexual activity: Yes    Birth control/protection: I.U.D.    Comment: Mirena  Other Topics Concern  . Not on file  Social History Narrative  . Not on file   Social Determinants of Health   Financial Resource Strain:   . Difficulty of Paying Living Expenses: Not on file  Food Insecurity:   . Worried About Charity fundraiser in the Last Year: Not on file  . Ran Out of Food in the Last Year: Not on file  Transportation Needs:   . Lack of Transportation (Medical): Not on file  . Lack of Transportation (Non-Medical): Not on file  Physical Activity:   . Days of Exercise per Week: Not on file  . Minutes of Exercise per Session: Not on  file  Stress:   . Feeling of Stress : Not on file  Social Connections:   . Frequency of Communication with Friends and Family: Not on file  . Frequency of Social Gatherings with Friends and Family: Not on file  . Attends Religious Services: Not on file  . Active Member of Clubs or Organizations: Not on file  . Attends Archivist Meetings: Not on file  . Marital Status: Not on file  Intimate Partner Violence:   . Fear of Current or Ex-Partner: Not on file  . Emotionally Abused: Not on file  . Physically Abused: Not on file  . Sexually Abused: Not on file      Review of Systems  Constitutional: Negative  for chills, diaphoresis and fatigue.  HENT: Positive for drooling. Negative for ear pain, postnasal drip and sinus pressure.   Eyes: Negative for photophobia, discharge, redness, itching and visual disturbance.  Respiratory: Negative for cough, shortness of breath and wheezing.   Cardiovascular: Negative for chest pain, palpitations and leg swelling.  Gastrointestinal: Negative for abdominal pain, constipation, diarrhea, nausea and vomiting.  Genitourinary: Negative for dysuria and flank pain.  Musculoskeletal: Negative for arthralgias, back pain, gait problem and neck pain.  Skin: Negative for color change.  Allergic/Immunologic: Negative for environmental allergies and food allergies.  Neurological: Positive for facial asymmetry. Negative for dizziness and headaches.  Hematological: Does not bruise/bleed easily.  Psychiatric/Behavioral: Negative for agitation, behavioral problems (depression) and hallucinations.    Vital Signs: BP 110/86   Pulse 78   Temp 98.1 F (36.7 C)   Resp 16   Ht 5\' 1"  (1.549 m)   Wt 166 lb 9.6 oz (75.6 kg)   SpO2 98%   BMI 31.48 kg/m    Physical Exam Constitutional:      Appearance: Normal appearance.  Cardiovascular:     Rate and Rhythm: Normal rate and regular rhythm.     Pulses: Normal pulses.     Heart sounds: Normal heart sounds.  Pulmonary:     Effort: Pulmonary effort is normal.     Breath sounds: Normal breath sounds.  Musculoskeletal:        General: Normal range of motion.     Cervical back: Normal range of motion.  Skin:    General: Skin is warm.  Neurological:     General: No focal deficit present.     Mental Status: She is alert and oriented to person, place, and time. Mental status is at baseline.     Comments: Slight facial asymmetry on right side  Psychiatric:        Mood and Affect: Mood normal.        Behavior: Behavior normal.        Thought Content: Thought content normal.    PDMP reviewed, Narcotic score 0, Sedative  score 0, Stimulant score 0, Overdose risk 120  Assessment/Plan: 1. Bell's palsy Due to ongoing symptoms, negative MRI, will refer to neurology for further assessment and management - Ambulatory referral to Neurology  2. GAD (generalized anxiety disorder) May continue to take alprazolam as needed to help with anxiety related to ongoing symptoms as needed - ALPRAZolam (XANAX) 0.25 MG tablet; Half to one tab tab as needed for panic attacks  Dispense: 30 tablet; Refill: 0  3. Flu vaccine need - Flu Vaccine MDCK QUAD PF  General Counseling: Denelda verbalizes understanding of the findings of todays visit and agrees with plan of treatment. I have discussed any further diagnostic evaluation that may  be needed or ordered today. We also reviewed her medications today. she has been encouraged to call the office with any questions or concerns that should arise related to todays visit.    Orders Placed This Encounter  Procedures  . Flu Vaccine MDCK QUAD PF  . Ambulatory referral to Neurology    Meds ordered this encounter  Medications  . ALPRAZolam (XANAX) 0.25 MG tablet    Sig: Half to one tab tab as needed for panic attacks    Dispense:  30 tablet    Refill:  0    Time spent: 30 Minutes Time spent includes review of chart, medications, test results and follow-up plan with the patient.  This patient was seen by Theodoro Grist AGNP-C in Collaboration with Dr Lavera Guise as a part of collaborative care agreement     Tanna Furry. Minard Millirons AGNP-C Internal medicine

## 2020-06-06 ENCOUNTER — Encounter: Payer: Self-pay | Admitting: Hospice and Palliative Medicine

## 2020-06-10 ENCOUNTER — Other Ambulatory Visit: Payer: Self-pay | Admitting: Hospice and Palliative Medicine

## 2020-06-10 ENCOUNTER — Encounter: Payer: Self-pay | Admitting: Hospice and Palliative Medicine

## 2020-06-10 MED ORDER — MELOXICAM 15 MG PO TABS: 15.0000 mg | ORAL_TABLET | Freq: Every day | ORAL | 0 refills | Status: DC

## 2020-06-10 MED ORDER — CYCLOBENZAPRINE HCL 10 MG PO TABS: 10.0000 mg | ORAL_TABLET | Freq: Every day | ORAL | 0 refills | Status: DC

## 2020-06-10 NOTE — Progress Notes (Signed)
Flexeril 10 mg QHS as well as Meloxicam 15 mg daily sent in for facial muscle pain/tension.

## 2020-06-24 LAB — HEMOGLOBIN A1C: Hemoglobin A1C: 5.4

## 2020-07-07 ENCOUNTER — Other Ambulatory Visit: Payer: Self-pay | Admitting: Hospice and Palliative Medicine

## 2020-07-09 DIAGNOSIS — G510 Bell's palsy: Secondary | ICD-10-CM | POA: Insufficient documentation

## 2020-07-15 ENCOUNTER — Encounter: Payer: Self-pay | Admitting: Hospice and Palliative Medicine

## 2020-07-15 ENCOUNTER — Ambulatory Visit: Payer: 59 | Admitting: Hospice and Palliative Medicine

## 2020-07-15 ENCOUNTER — Other Ambulatory Visit: Payer: Self-pay

## 2020-07-15 DIAGNOSIS — G510 Bell's palsy: Secondary | ICD-10-CM

## 2020-07-15 DIAGNOSIS — H6123 Impacted cerumen, bilateral: Secondary | ICD-10-CM

## 2020-07-15 NOTE — Progress Notes (Signed)
[facility] [place], [place] [address]  Internal MEDICINE  Office Visit Note  Patient Name: Jamie Kennedy  [id number]  [id number]  Date of Service: 07/19/2020  Chief Complaint  Patient presents with  . Follow-up  . Anxiety    HPI Patient is here for routine follow-up Emotionally drained due to ongoing right sided facial pain and numbness Describes that she feels an irritation on the roof of her mouth and further down into the back of her throat Was evaluated by neurologist--unsure if symptoms were consistent with Bell's Palsy Has started acupuncture therapy for facial symptoms--has not noticed improvement Has developed sinus pressure and discomfort in her ears  Becoming increasingly frustrated as her symptoms persist and has not be given any definitive answers or treatment options  Current Medication: Outpatient Encounter Medications as of 07/15/2020  Medication Sig  . [DISCONTINUED] cyclobenzaprine (FLEXERIL) 10 MG tablet Take 1 tablet (10 mg total) by mouth at bedtime.  . [DISCONTINUED] meloxicam (MOBIC) 15 MG tablet TAKE 1 TABLET(15 MG) BY MOUTH DAILY   No facility-administered encounter medications on file as of 07/15/2020.    Surgical History: History reviewed. No pertinent surgical history.  Medical History: Past Medical History:  Diagnosis Date  . GAD (generalized anxiety disorder)     Family History: Family History  Problem Relation Age of Onset  . Arthritis/Rheumatoid Mother   . Anxiety disorder Brother   . Parkinson's disease Maternal Grandfather   . Dementia Maternal Grandfather     Social History   Socioeconomic History  . Marital status: Married    Spouse name: Not on file  . Number of children: Not on file  . Years of education: Not on file  . Highest education level: Not on file  Occupational History  . Not on file  Tobacco Use  . Smoking status: Former Smoker    Types: Cigarettes    Quit date: 04/07/2020     Years since quitting: 0.2  . Smokeless tobacco: Never Used  . Tobacco comment: smoke e-cig daily  Vaping Use  . Vaping Use: Never used  Substance and Sexual Activity  . Alcohol use: Never  . Drug use: Never  . Sexual activity: Yes    Birth control/protection: I.U.D.    Comment: Mirena  Other Topics Concern  . Not on file  Social History Narrative  . Not on file   Social Determinants of Health   Financial Resource Strain:   . Difficulty of Paying Living Expenses: Not on file  Food Insecurity:   . Worried About Charity fundraiser in the Last Year: Not on file  . Ran Out of Food in the Last Year: Not on file  Transportation Needs:   . Lack of Transportation (Medical): Not on file  . Lack of Transportation (Non-Medical): Not on file  Physical Activity:   . Days of Exercise per Week: Not on file  . Minutes of Exercise per Session: Not on file  Stress:   . Feeling of Stress : Not on file  Social Connections:   . Frequency of Communication with Friends and Family: Not on file  . Frequency of Social Gatherings with Friends and Family: Not on file  . Attends Religious Services: Not on file  . Active Member of Clubs or Organizations: Not on file  . Attends Archivist Meetings: Not on file  . Marital Status: Not on file  Intimate Partner Violence:   . Fear of Current or Ex-Partner: Not on  file  . Emotionally Abused: Not on file  . Physically Abused: Not on file  . Sexually Abused: Not on file      Review of Systems  Constitutional: Negative for chills, diaphoresis and fatigue.  HENT: Positive for ear pain, sinus pressure and sore throat. Negative for postnasal drip.   Eyes: Negative for photophobia, discharge, redness, itching and visual disturbance.  Respiratory: Negative for cough, shortness of breath and wheezing.   Cardiovascular: Negative for chest pain, palpitations and leg swelling.  Gastrointestinal: Negative for abdominal pain, constipation, diarrhea,  nausea and vomiting.  Genitourinary: Negative for dysuria and flank pain.  Musculoskeletal: Negative for arthralgias, back pain, gait problem and neck pain.  Skin: Negative for color change.  Allergic/Immunologic: Negative for environmental allergies and food allergies.  Neurological: Positive for numbness. Negative for dizziness and headaches.       Tingling to right side of face  Hematological: Does not bruise/bleed easily.  Psychiatric/Behavioral: Negative for agitation, behavioral problems (depression) and hallucinations. The patient is nervous/anxious.     Vital Signs: BP 117/88   Pulse 97   Temp 98.7 F (37.1 C)   Resp 16   Ht 5\' 1"  (1.549 m)   Wt 163 lb 12.8 oz (74.3 kg)   SpO2 98%   BMI 30.95 kg/m    Physical Exam Vitals reviewed.  Constitutional:      Appearance: Normal appearance.  HENT:     Right Ear: There is impacted cerumen.     Left Ear: There is impacted cerumen.     Nose: Nose normal.     Mouth/Throat:     Mouth: Mucous membranes are moist.  Eyes:     Pupils: Pupils are equal, round, and reactive to light.  Cardiovascular:     Rate and Rhythm: Normal rate and regular rhythm.     Pulses: Normal pulses.     Heart sounds: Normal heart sounds.  Pulmonary:     Effort: Pulmonary effort is normal.     Breath sounds: Normal breath sounds.  Musculoskeletal:        General: Normal range of motion.     Cervical back: Normal range of motion.  Skin:    General: Skin is warm.  Neurological:     General: No focal deficit present.     Mental Status: She is alert and oriented to person, place, and time. Mental status is at baseline.     Comments: Mildly flattened right sided nasal folds  Psychiatric:        Attention and Perception: Attention normal.        Mood and Affect: Mood is anxious. Affect is tearful.        Behavior: Behavior normal. Behavior is cooperative.        Thought Content: Thought content normal.        Cognition and Memory: Cognition normal.         Judgment: Judgment normal.     Comments: Baseline speech impediment    Assessment/Plan: 1. Bell's palsy Recurring symptoms, MRI negative, neurology recommended acupuncture therapy Dr. Humphrey Rolls to call and discuss differentials and need for further testing/imaging--will follow-up with patient  2. Bilateral impacted cerumen Bilateral ear irrigation performed in office  General Counseling: Cartier verbalizes understanding of the findings of todays visit and agrees with plan of treatment. I have discussed any further diagnostic evaluation that may be needed or ordered today. We also reviewed her medications today. she has been encouraged to call the office with any questions  or concerns that should arise related to todays visit.   Time spent: 30Minutes Time spent includes review of chart, medications, test results and follow-up plan with the patient.  This patient was seen by Theodoro Grist AGNP-C in Collaboration with Dr Lavera Guise as a part of collaborative care agreement     Tanna Furry. Alizandra Loh AGNP-C Internal medicine

## 2020-07-17 ENCOUNTER — Encounter: Payer: Self-pay | Admitting: Internal Medicine

## 2020-07-19 ENCOUNTER — Encounter: Payer: Self-pay | Admitting: Hospice and Palliative Medicine

## 2020-07-24 ENCOUNTER — Other Ambulatory Visit: Payer: Self-pay

## 2020-07-24 ENCOUNTER — Ambulatory Visit: Payer: 59 | Admitting: Nurse Practitioner

## 2020-07-24 ENCOUNTER — Encounter: Payer: Self-pay | Admitting: Nurse Practitioner

## 2020-07-24 VITALS — BP 131/64 | HR 90 | Temp 97.5°F | Resp 16 | Ht 61.0 in | Wt 160.8 lb

## 2020-07-24 DIAGNOSIS — R5383 Other fatigue: Secondary | ICD-10-CM

## 2020-07-24 DIAGNOSIS — F411 Generalized anxiety disorder: Secondary | ICD-10-CM | POA: Diagnosis not present

## 2020-07-24 DIAGNOSIS — J324 Chronic pansinusitis: Secondary | ICD-10-CM

## 2020-07-24 DIAGNOSIS — G500 Trigeminal neuralgia: Secondary | ICD-10-CM

## 2020-07-24 MED ORDER — METHYLPREDNISOLONE 4 MG PO TBPK: ORAL_TABLET | ORAL | 0 refills | Status: DC

## 2020-07-24 MED ORDER — CEFUROXIME AXETIL 500 MG PO TABS: 500.0000 mg | ORAL_TABLET | Freq: Two times a day (BID) | ORAL | 0 refills | Status: DC

## 2020-07-24 MED ORDER — ALPRAZOLAM 0.5 MG PO TBDP: ORAL_TABLET | ORAL | 0 refills | Status: DC

## 2020-07-24 NOTE — Progress Notes (Signed)
[facility] [place], [place] [address]  Internal MEDICINE  Office Visit Note  Patient Name: Jamie Kennedy  [id number]  [id number]  Date of Service: 08/23/2020  Chief Complaint  Patient presents with  . Acute Visit  . Facial Pain    Right cheek, jaw, mouth (roof of mouth), nose, between eyes, constant dull pain and pressure, then suddenly at once all those places explode with sharp and throbbing pain, lasts at least 2 minutes pain is at a 10 then the rest of the day pain is at a 7, happens more frequently      The patient is here for acute visit.  We have been following her for acute symptoms of Bells Palsy Original outbreak 09/07-unable to tolerate treatment--Famvir and Prednisone. prednisone caused her to have palpitations, rapid heart rate, and anxiety. Never had any antibiotic for this. Since initial outbreak--symptoms continue to fluctuate, will improve for several days but then symptoms will return Symptoms consist of right sided facial pain and numbness, right eye watering, drooling as well as right sided facial asymmetry Due to ongoing symptoms we proceeded with MRI brain Reviewed results--normal MRI Now unable to sleep. If laying on the right side, pain in right side of the cheek, jaw, and ear are intolerable. If laying on left side, feels like pressure being put on the right side of the cheek, mouth, jaw, and ear. If laying on her back, she gets pain and tingling sensation in the back of her neck which radiates around both sides of the jaw. Has seen a dentist this week. s-rays have been done. No problems have been found. Trigeminal neuralgia has been considered. Symptoms not consistent with this.  Feels as though her sinuses are going to explode when an "attacks."  Causing her increased anxiety. States that her alprazolam 0.25mg  tablets are not helping to relieve the anxiety at all.    Pt is here for a sick visit.     Current  Medication:  Outpatient Encounter Medications as of 07/24/2020  Medication Sig  . [DISCONTINUED] ALPRAZolam (NIRAVAM) 0.5 MG dissolvable tablet Take 1 tablet po one to two times daily as needed (Patient not taking: No sig reported)  . [DISCONTINUED] cefUROXime (CEFTIN) 500 MG tablet Take 1 tablet (500 mg total) by mouth 2 (two) times daily with a meal.  . [DISCONTINUED] methylPREDNISolone (MEDROL) 4 MG TBPK tablet Take by mouth as directed for 6 days (Patient not taking: No sig reported)   No facility-administered encounter medications on file as of 07/24/2020.      Medical History: Past Medical History:  Diagnosis Date  . GAD (generalized anxiety disorder)      Today's Vitals   07/24/20 1146  BP: 131/64  Pulse: 90  Resp: 16  Temp: (!) 97.5 F (36.4 C)  SpO2: 98%  Weight: 160 lb 12.8 oz (72.9 kg)  Height: 5\' 1"  (1.549 m)   Body mass index is 30.38 kg/m.  Review of Systems  Constitutional: Negative for chills, fatigue and unexpected weight change.  HENT: Positive for ear pain, facial swelling, sinus pressure and sinus pain. Negative for congestion, postnasal drip, rhinorrhea, sneezing and sore throat.        Pain and tingling of right side of face. Tenderness of the right check, right ear, and right forehead. Mild swelling present.   Eyes: Positive for pain.       Pain and tenderness behind right eye  Respiratory: Negative for cough, chest tightness, shortness of breath and  wheezing.   Cardiovascular: Negative for chest pain and palpitations.  Gastrointestinal: Negative for abdominal pain, constipation, diarrhea, nausea and vomiting.  Musculoskeletal: Negative for arthralgias, back pain, joint swelling and neck pain.  Skin: Negative for rash.  Allergic/Immunologic: Positive for environmental allergies.  Neurological: Positive for numbness and headaches. Negative for tremors.       Tingling of right side of face.  Hematological: Negative for adenopathy. Does not  bruise/bleed easily.  Psychiatric/Behavioral: Negative for behavioral problems (Depression), sleep disturbance and suicidal ideas. The patient is not nervous/anxious.     Physical Exam Vitals and nursing note reviewed.  Constitutional:      General: She is not in acute distress.    Appearance: Normal appearance. She is well-developed and well-nourished. She is not diaphoretic.  HENT:     Head: Normocephalic and atraumatic.     Right Ear: Tympanic membrane is erythematous and bulging.     Left Ear: Tympanic membrane is erythematous and bulging.     Nose:     Right Sinus: Maxillary sinus tenderness and frontal sinus tenderness present.     Left Sinus: Maxillary sinus tenderness and frontal sinus tenderness present.     Comments: Right sinus tenderness more severe than left     Mouth/Throat:     Mouth: Oropharynx is clear and moist.     Pharynx: No oropharyngeal exudate.  Eyes:     Extraocular Movements: EOM normal.     Pupils: Pupils are equal, round, and reactive to light.  Neck:     Thyroid: No thyromegaly.     Vascular: No JVD.     Trachea: No tracheal deviation.  Cardiovascular:     Rate and Rhythm: Normal rate and regular rhythm.     Heart sounds: Normal heart sounds. No murmur heard. No friction rub. No gallop.   Pulmonary:     Effort: Pulmonary effort is normal. No respiratory distress.     Breath sounds: Normal breath sounds. No wheezing or rales.  Chest:     Chest wall: No tenderness.  Abdominal:     General: Bowel sounds are normal.     Palpations: Abdomen is soft.  Musculoskeletal:        General: Normal range of motion.     Cervical back: Normal range of motion and neck supple.  Lymphadenopathy:     Cervical: No cervical adenopathy.  Skin:    General: Skin is warm and dry.  Neurological:     General: No focal deficit present.     Mental Status: She is alert and oriented to person, place, and time.     Cranial Nerves: No cranial nerve deficit.  Psychiatric:         Mood and Affect: Mood and affect and mood normal.        Behavior: Behavior normal.        Thought Content: Thought content normal.        Judgment: Judgment normal.    Assessment/Plan: 1. Chronic pansinusitis Start cerftin 500mg  twice daily for 10 days. Consider CT sinuses for further evaluation if no improvement  2. Trigeminal neuralgia syndrome Medrol dose pack. Take as directed for 6 days.   3. Other fatigue Check labs.   4. GAD (generalized anxiety disorder) May take alprazolam 0.5mg  up to twice daily as needed for acute anxiety. Single prescription for #45 tablets sent to her pharmacy.   General Counseling: Zienna verbalizes understanding of the findings of todays visit and agrees with plan of treatment. I  have discussed any further diagnostic evaluation that may be needed or ordered today. We also reviewed her medications today. she has been encouraged to call the office with any questions or concerns that should arise related to todays visit.    Counseling:   This patient was seen by [place] with Dr Lavera Guise as a part of collaborative care agreement  Meds ordered this encounter  Medications  . DISCONTD: methylPREDNISolone (MEDROL) 4 MG TBPK tablet    Sig: Take by mouth as directed for 6 days    Dispense:  21 tablet    Refill:  0    Order Specific Question:   Supervising Provider    Answer:   Lavera Guise [place]  . DISCONTD: cefUROXime (CEFTIN) 500 MG tablet    Sig: Take 1 tablet (500 mg total) by mouth 2 (two) times daily with a meal.    Dispense:  20 tablet    Refill:  0    Order Specific Question:   Supervising Provider    Answer:   Lavera Guise [place]  . DISCONTD: ALPRAZolam (NIRAVAM) 0.5 MG dissolvable tablet    Sig: Take 1 tablet po one to two times daily as needed    Dispense:  45 tablet    Refill:  0    Order Specific Question:   Supervising Provider    Answer:   Lavera Guise [[id number]]    Time spent: 40 Minutes

## 2020-07-25 ENCOUNTER — Emergency Department (HOSPITAL_COMMUNITY)
Admission: EM | Admit: 2020-07-25 | Discharge: 2020-07-25 | Disposition: A | Discharge status: Home / Self Care | Payer: 59 | Attending: Emergency Medicine | Admitting: Emergency Medicine

## 2020-07-25 ENCOUNTER — Emergency Department (HOSPITAL_COMMUNITY): Payer: 59

## 2020-07-25 ENCOUNTER — Encounter (HOSPITAL_COMMUNITY): Payer: Self-pay | Admitting: Emergency Medicine

## 2020-07-25 DIAGNOSIS — Z87891 Personal history of nicotine dependence: Secondary | ICD-10-CM | POA: Diagnosis not present

## 2020-07-25 DIAGNOSIS — G8929 Other chronic pain: Secondary | ICD-10-CM | POA: Diagnosis not present

## 2020-07-25 DIAGNOSIS — R6884 Jaw pain: Secondary | ICD-10-CM | POA: Diagnosis not present

## 2020-07-25 DIAGNOSIS — F411 Generalized anxiety disorder: Secondary | ICD-10-CM | POA: Insufficient documentation

## 2020-07-25 DIAGNOSIS — R519 Headache, unspecified: Secondary | ICD-10-CM | POA: Insufficient documentation

## 2020-07-25 DIAGNOSIS — Z79899 Other long term (current) drug therapy: Secondary | ICD-10-CM | POA: Diagnosis not present

## 2020-07-25 NOTE — ED Triage Notes (Signed)
Patient c/o four month history of jaw pain, mouth pain, facial pain, sinus pain that initially started after URI. States pain was intermittent but has been persistent since the end October. Has been seen by neuro with MRI and PCP. States xanax was helping with symptoms at first. Dx with Bell's palsy but prednisone and antiviral were reportedly discontinued after causing palpitations.

## 2020-07-25 NOTE — ED Provider Notes (Signed)
[facility] DEPT Provider Note   CSN: [id number] Arrival date & time: 07/25/20  1103     History Chief Complaint  Patient presents with   Oral Pain   Facial Pain    Jamie Kennedy is a 34 y.o. female.  HPI   Patient with significant medical history of GERD, presents to the emergency department with chief complaint of right-sided facial pain that has been going on for the last 4 months.  Patient endorses this pain has worsen over the last few weeks, she describes a consistent sharp throbbing sensation which she feels in the back of her throat rating along the roof of her mouth and into her right side right facial sinuses and jaw.  She states the pain becomes more excruciating at nighttime when she lays down in bed, feels pain rating from her back of her neck to the front of her face, she also endorses extreme sensitivity along her upper lip when she touches her top lip, gets a sharp sensation which radiates back into her right jaw.  She states she feels like the muscles within her throat become weak almost as if something is stuck in it.  She endorses that over the last few days she has had some nasal congestion and a scratchy throat but feels this is most likely due to her allergies which she gets during inclement weather.  Patient endorses this all started about 4 months ago where she had a URI and then started to feel a electric sensation on the right side of her face.  She went to her PCP who thought she may have had Bell's palsy she started on steroids and antivirals but can only take one course of it as her heart rate shot up.  She then was sent to her neurologist who did an MRI of her brain which did not reveal any acute findings.  She states her pain has not gotten better but gotten worse.  She has been taking Xanax which seems to help with her pain.  She also evaluated by her dentist who did not find any dental cavities or other abnormalities.  She denies  history of autoimmune disease, recent tick bites, rashes, fevers, chills, unexplained weight loss.  Patient was seen by her PCP yesterday who started her on antibiotic for possible infection.  She is supposed to follow-up with her neurologist next week.  Patient denies chest pain, shortness of breath, abdominal pain, nausea, vomiting, diarrhea, pedal edema.  Past Medical History:  Diagnosis Date   GAD (generalized anxiety disorder)     Patient Active Problem List   Diagnosis Date Noted   Other fatigue 03/01/2020   Irritable bowel syndrome with constipation 10/15/2019   Hematuria 09/27/2019   Bladder spasms 09/27/2019   Pelvic pain 09/27/2019   Urinary tract infection without hematuria 09/13/2019   Vaginal yeast infection 09/13/2019   Encounter for general adult medical examination with abnormal findings 01/22/2019   GAD (generalized anxiety disorder) 01/22/2019   Screening for malignant neoplasm of cervix 01/22/2019   Dysuria 01/22/2019    History reviewed. No pertinent surgical history.   OB History    Gravida  3   Para  3   Term  3   Preterm      AB      Living  3     SAB      IAB      Ectopic      Multiple  1   Live Births  Family History  Problem Relation Age of Onset   Arthritis/Rheumatoid Mother    Anxiety disorder Brother    Parkinson's disease Maternal Grandfather    Dementia Maternal Grandfather     Social History   Tobacco Use   Smoking status: Former Smoker    Types: Cigarettes    Quit date: 04/07/2020    Years since quitting: 0.2   Smokeless tobacco: Never Used   Tobacco comment: smoke e-cig daily  Vaping Use   Vaping Use: Never used  Substance Use Topics   Alcohol use: Never   Drug use: Never    Home Medications Prior to Admission medications   Medication Sig Start Date End Date Taking? Authorizing Provider  ALPRAZolam Duanne Moron) 0.25 MG tablet Take 0.25 mg by mouth as needed for anxiety.  06/03/20  Yes [provider]  ascorbic acid (VITAMIN C) 500 MG tablet Take 500 mg by mouth daily.   Yes [provider]  cefUROXime (CEFTIN) 500 MG tablet Take 1 tablet (500 mg total) by mouth 2 (two) times daily with a meal. 07/24/20  Yes Boscia, Heather E, NP  Melatonin 5 MG CAPS Take 5 mg by mouth at bedtime.   Yes [provider]  VITAMIN D PO Take 1 tablet by mouth daily.   Yes [provider]  ALPRAZolam (NIRAVAM) 0.5 MG dissolvable tablet Take 1 tablet po one to two times daily as needed Patient not taking: No sig reported 07/24/20   Ronnell Freshwater, NP  methylPREDNISolone (MEDROL) 4 MG TBPK tablet Take by mouth as directed for 6 days Patient not taking: No sig reported 07/24/20   Ronnell Freshwater, NP    Allergies    Sulfa antibiotics  Review of Systems   Review of Systems  Constitutional: Negative for chills and fever.  HENT: Positive for congestion, ear pain, sinus pressure and sinus pain. Negative for dental problem and sore throat.   Eyes: Negative for visual disturbance.  Respiratory: Positive for cough. Negative for shortness of breath.   Cardiovascular: Negative for chest pain.  Gastrointestinal: Negative for abdominal pain, diarrhea, nausea and vomiting.  Genitourinary: Negative for enuresis.  Musculoskeletal: Negative for back pain.  Skin: Negative for rash.  Neurological: Negative for headaches.  Hematological: Does not bruise/bleed easily.    Physical Exam Updated Vital Signs BP 121/84    Pulse 83    Temp 98.3 F (36.8 C)    Resp 16    SpO2 96%   Physical Exam Vitals and nursing note reviewed.  Constitutional:      General: She is not in acute distress.    Appearance: Normal appearance. She is not ill-appearing or diaphoretic.  HENT:     Head: Normocephalic and atraumatic.     Comments: Patient's face was palpated she had some slight tenderness along her maxillary sinus on the right side, as well as some crepitus  noting on her right TMJ with opening closing her mouth.    Right Ear: Tympanic membrane, ear canal and external ear normal.     Left Ear: Tympanic membrane, ear canal and external ear normal.     Nose: Congestion present. No rhinorrhea.     Comments: Patient's right nasal passage was patent, patient's left nasal passage was slightly congested, she had bilateral erythematous turbinates.    Mouth/Throat:     Mouth: Mucous membranes are moist.     Pharynx: Oropharynx is clear. No oropharyngeal exudate or posterior oropharyngeal erythema.     Comments: Oropharynx is visualized  tongue and uvula were both midline, she was controlling her own secretions out difficulty.  No cavities noted, no edema or notable abscess noted along patient's gumlines.  Gumlines were palpated no fluctuance or indurations felt. Eyes:     General: No visual field deficit or scleral icterus.    Extraocular Movements: Extraocular movements intact.     Conjunctiva/sclera: Conjunctivae normal.     Pupils: Pupils are equal, round, and reactive to light.  Neck:     Comments: Patient had no tenderness along her cervical spine, she did have some tenderness along the occipital lobe, no crepitus or deformities palpated. Cardiovascular:     Rate and Rhythm: Normal rate and regular rhythm.     Pulses: Normal pulses.     Heart sounds: No murmur heard. No friction rub. No gallop.   Pulmonary:     Effort: Pulmonary effort is normal. No respiratory distress.     Breath sounds: No wheezing, rhonchi or rales.  Abdominal:     General: There is no distension.     Palpations: Abdomen is soft.     Tenderness: There is no abdominal tenderness. There is no guarding.  Musculoskeletal:        General: No swelling or tenderness.     Cervical back: Normal range of motion and neck supple.     Right lower leg: No edema.     Left lower leg: No edema.     Comments: Patient is moving all 4 extremities out difficulty.  Skin:    General: Skin is  warm and dry.     Findings: No rash.     Comments: Limited skin exam was performed there is no erythematous, swollen joints on my exam, there is no noted ulcers or rashes along patient's extremities back or abdomen.  Neurological:     General: No focal deficit present.     Mental Status: She is alert.     GCS: GCS eye subscore is 4. GCS verbal subscore is 5. GCS motor subscore is 6.     Cranial Nerves: Cranial nerves are intact. No cranial nerve deficit or facial asymmetry.     Sensory: Sensation is intact. No sensory deficit.     Motor: Motor function is intact. No weakness or pronator drift.     Coordination: Coordination is intact. Romberg sign negative. Finger-Nose-Finger Test normal.     Comments: Patient is having no difficulty with word finding.  Psychiatric:        Mood and Affect: Mood normal.     ED Results / Procedures / Treatments   Labs (all labs ordered are listed, but only abnormal results are displayed) Labs Reviewed - No data to display  EKG None  Radiology CT Maxillofacial WO CM  Result Date: 07/25/2020 CLINICAL DATA:  Jaw pain. EXAM: CT MAXILLOFACIAL WITHOUT CONTRAST TECHNIQUE: Multidetector CT imaging of the maxillofacial structures was performed. Multiplanar CT image reconstructions were also generated. COMPARISON:  None. FINDINGS: Osseous: No fracture or mandibular dislocation. No destructive process. Orbits: Negative. No traumatic or inflammatory finding. Sinuses: Clear. Soft tissues: Negative. Limited intracranial: No significant or unexpected finding. IMPRESSION: Normal maxillofacial CT. Electronically Signed   By: Marijo Conception M.D.   On: 07/25/2020 14:45    Procedures Procedures (including critical care time)  Medications Ordered in ED Medications - No data to display  ED Course  I have reviewed the triage vital signs and the nursing notes.  Pertinent labs & imaging results that were available during my care  of the patient were reviewed by me and  considered in my medical decision making (see chart for details).    MDM Rules/Calculators/A&P                          Patient presents with worsening chronic right-sided facial pain.  She was alert, does not appear in acute distress, vital signs sent for slight tachycardia.  Due to well-appearing patient, benign physical exam further lab work is not indicated will order imaging of face for further evaluation.  Patient's maxillofacial CT was negative for any acute findings.  Low suspicion for Bell's palsy as cranial nerves are fully intact.  Low suspicion for CVA or intracranial head bleed as there is no deficits on my exam. I have low suspicion for peritonsillar abscess, retropharyngeal abscess, or Ludwig angina as oropharynx was visualized tongue and uvula were both midline, there is no exudates, erythema or edema noted in the posterior pillars or on/ around tonsils.  Low suspicion for strep throat as there is no erythematous rashes nor my exam.  No suspicion for sinusitis or facial abscess as CT imaging does not reveal any acute findings.  Low suspicion for an abscess within the gumline,  no fluctuance or induration felt.  Low suspicion for periorbital or orbital cellulitis as patient face had no erythematous, patient EOMs were intact, he had no pain with eye movement.  Patient has right-sided facial pain with unknown etiology.  Differential diagnosis includes TMJ versus trigeminal neuralgia versus atypical Bell's palsy.  Would recommend following up with neurology and/or ENT for further evaluation.  Vital signs have remained stable, no indication for hospital admission. Patient given at home care as well strict return precautions.  Patient verbalized that they understood agreed to said plan.     Final Clinical Impression(s) / ED Diagnoses Final diagnoses:  Right-sided face pain    Rx / DC Orders ED Discharge Orders    None       Marcello Fennel, PA-C 07/25/20 1558    Daleen Bo, MD 07/25/20 2011

## 2020-07-25 NOTE — Discharge Instructions (Signed)
seen for right-sided facial pain.  Exam and imaging all look reassuring.  I recommend continuing with your medications as prescribed.  You may take over-the-counter pain medications like ibuprofen and Tylenol every 6 as needed please follow dosages on the back of bottle.  I recommend following up with your neurologist or an ENT for further evaluation.  Provide you with contact information above for an ENT please call.  Come back to the emergency department if you develop chest pain, shortness of breath, severe abdominal pain, uncontrolled nausea, vomiting, diarrhea.

## 2020-07-27 ENCOUNTER — Other Ambulatory Visit: Payer: Self-pay | Admitting: Nurse Practitioner

## 2020-07-28 ENCOUNTER — Other Ambulatory Visit: Payer: Self-pay | Admitting: Nurse Practitioner

## 2020-07-28 ENCOUNTER — Encounter: Payer: Self-pay | Admitting: Nurse Practitioner

## 2020-07-28 DIAGNOSIS — F411 Generalized anxiety disorder: Secondary | ICD-10-CM

## 2020-07-28 LAB — CBC
Hematocrit: 39.7 % (ref 34.0–46.6)
Hemoglobin: 13.8 g/dL (ref 11.1–15.9)
MCH: 30.8 pg (ref 26.6–33.0)
MCHC: 34.8 g/dL (ref 31.5–35.7)
MCV: 89 fL (ref 79–97)
Platelets: 239 10*3/uL (ref 150–450)
RBC: 4.48 x10E6/uL (ref 3.77–5.28)
RDW: 11.7 % (ref 11.7–15.4)
WBC: 6.5 10*3/uL (ref 3.4–10.8)

## 2020-07-28 LAB — SEDIMENTATION RATE: Sed Rate: 4 mm/hr (ref 0–32)

## 2020-07-28 LAB — BASIC METABOLIC PANEL
BUN/Creatinine Ratio: 12 (ref 9–23)
BUN: 9 mg/dL (ref 6–20)
CO2: 23 mmol/L (ref 20–29)
Calcium: 9.6 mg/dL (ref 8.7–10.2)
Chloride: 104 mmol/L (ref 96–106)
Creatinine, Ser: 0.76 mg/dL (ref 0.57–1.00)
GFR calc Af Amer: 118 mL/min/{1.73_m2} (ref >59)
GFR calc non Af Amer: 103 mL/min/{1.73_m2} (ref >59)
Glucose: 87 mg/dL (ref 65–99)
Potassium: 4.1 mmol/L (ref 3.5–5.2)
Sodium: 141 mmol/L (ref 134–144)

## 2020-07-28 LAB — ANA W/REFLEX: Anti Nuclear Antibody (ANA): NEGATIVE

## 2020-07-28 LAB — RHEUMATOID FACTOR: Rheumatoid fact SerPl-aCnc: <10 IU/mL (ref <14.0)

## 2020-07-28 MED ORDER — ALPRAZOLAM 0.5 MG PO TBDP: ORAL_TABLET | ORAL | 0 refills | Status: DC

## 2020-07-29 ENCOUNTER — Other Ambulatory Visit: Payer: Self-pay | Admitting: Nurse Practitioner

## 2020-07-29 DIAGNOSIS — F411 Generalized anxiety disorder: Secondary | ICD-10-CM

## 2020-07-29 MED ORDER — BUSPIRONE HCL 10 MG PO TABS: ORAL_TABLET | ORAL | 1 refills | Status: DC

## 2020-07-29 NOTE — Telephone Encounter (Signed)
Please check on this.

## 2020-08-06 ENCOUNTER — Ambulatory Visit (INDEPENDENT_AMBULATORY_CARE_PROVIDER_SITE_OTHER): Payer: 59 | Admitting: Hospice and Palliative Medicine

## 2020-08-06 ENCOUNTER — Other Ambulatory Visit: Payer: Self-pay

## 2020-08-06 ENCOUNTER — Encounter: Payer: Self-pay | Admitting: Hospice and Palliative Medicine

## 2020-08-06 VITALS — BP 114/76 | HR 88 | Temp 97.5°F | Resp 16 | Ht 61.0 in | Wt 160.6 lb

## 2020-08-06 DIAGNOSIS — G518 Other disorders of facial nerve: Secondary | ICD-10-CM

## 2020-08-06 DIAGNOSIS — B373 Candidiasis of vulva and vagina: Secondary | ICD-10-CM

## 2020-08-06 DIAGNOSIS — B3731 Acute candidiasis of vulva and vagina: Secondary | ICD-10-CM

## 2020-08-06 MED ORDER — GABAPENTIN 100 MG PO CAPS: ORAL_CAPSULE | ORAL | 0 refills | Status: DC

## 2020-08-06 MED ORDER — OXYCODONE HCL 5 MG PO TABS: 5.0000 mg | ORAL_TABLET | Freq: Four times a day (QID) | ORAL | 0 refills | Status: DC | PRN

## 2020-08-06 MED ORDER — FLUCONAZOLE 150 MG PO TABS: ORAL_TABLET | ORAL | 0 refills | Status: DC

## 2020-08-06 NOTE — Progress Notes (Signed)
[facility] [address] [place], [place] [address]  Internal MEDICINE  Office Visit Note  Patient Name: Jamie Kennedy  [id number]  [id number]  Date of Service: 08/14/2020  Chief Complaint  Patient presents with  . Follow-up    Severe facial pain, cant lay down on her back or stomach without feeling pain in head  . Sinusitis  . Anxiety    HPI Patient is here for routine follow-up Have been following for atypical facial neuralgia--has been evaluated by neurology--diagnosis remains unclear, no further imaging or treatment MRI head, CT sinuses normal, atypical labs--ANA, sed rate--normal  Facial pain so severe she was seen in emergency department--again, unclear diagnosis, no further treatment or recommendations  Symptoms remain today of left sided facial pain and pressure, intermittent speech disturbance Pain at times radiates to right side in what she described as a band of pain and pressure  Was recently treated with antibiotics for possible sinusitis and is now having vaginal yeast infection symptoms  Current Medication: Outpatient Encounter Medications as of 08/06/2020  Medication Sig  . ALPRAZolam (NIRAVAM) 0.5 MG dissolvable tablet Take 1 tablet po BID prn anxiety  . ascorbic acid (VITAMIN C) 500 MG tablet Take 500 mg by mouth daily.  . busPIRone (BUSPAR) 10 MG tablet Take 1/2 to 1 tablet po BID prn anxiety  . fluconazole (DIFLUCAN) 150 MG tablet Take one tablet by mouth once, may repeat in three days if symptoms have not resolved.  . gabapentin (NEURONTIN) 100 MG capsule Take 1 tablet by mouth in the morning, take 2-3 tablets by mouth at bedtime.  . Melatonin 5 MG CAPS Take 5 mg by mouth at bedtime.  Marland Kitchen oxyCODONE (ROXICODONE) 5 MG immediate release tablet Take 1 tablet (5 mg total) by mouth every 6 (six) hours as needed for severe pain.  Marland Kitchen VITAMIN D PO Take 1 tablet by mouth daily.  . [DISCONTINUED] cefUROXime (CEFTIN) 500 MG tablet Take 1 tablet (500 mg  total) by mouth 2 (two) times daily with a meal.  . [DISCONTINUED] methylPREDNISolone (MEDROL) 4 MG TBPK tablet Take by mouth as directed for 6 days (Patient not taking: No sig reported)   No facility-administered encounter medications on file as of 08/06/2020.    Surgical History: History reviewed. No pertinent surgical history.  Medical History: Past Medical History:  Diagnosis Date  . GAD (generalized anxiety disorder)     Family History: Family History  Problem Relation Age of Onset  . Arthritis/Rheumatoid Mother   . Anxiety disorder Brother   . Parkinson's disease Maternal Grandfather   . Dementia Maternal Grandfather     Social History   Socioeconomic History  . Marital status: Married    Spouse name: Not on file  . Number of children: Not on file  . Years of education: Not on file  . Highest education level: Not on file  Occupational History  . Not on file  Tobacco Use  . Smoking status: Former Smoker    Types: Cigarettes    Quit date: 04/07/2020    Years since quitting: 0.3  . Smokeless tobacco: Never Used  . Tobacco comment: smoke e-cig daily  Vaping Use  . Vaping Use: Never used  Substance and Sexual Activity  . Alcohol use: Never  . Drug use: Never  . Sexual activity: Yes    Birth control/protection: I.U.D.    Comment: Mirena  Other Topics Concern  . Not on file  Social History Narrative  . Not on file  Social Determinants of Health   Financial Resource Strain: Not on file  Food Insecurity: Not on file  Transportation Needs: Not on file  Physical Activity: Not on file  Stress: Not on file  Social Connections: Not on file  Intimate Partner Violence: Not on file      Review of Systems  Constitutional: Negative for chills, diaphoresis and fatigue.  HENT: Negative for ear pain, postnasal drip and sinus pressure.        Left sided facial pain and pressure, intermittent numbness  Eyes: Negative for photophobia, discharge, redness, itching  and visual disturbance.  Respiratory: Negative for cough, shortness of breath and wheezing.   Cardiovascular: Negative for chest pain, palpitations and leg swelling.  Gastrointestinal: Negative for abdominal pain, constipation, diarrhea, nausea and vomiting.  Genitourinary: Negative for dysuria and flank pain.  Musculoskeletal: Negative for arthralgias, back pain, gait problem and neck pain.  Skin: Negative for color change.  Allergic/Immunologic: Negative for environmental allergies and food allergies.  Neurological: Positive for headaches. Negative for dizziness.       Intermittent abnormal speech or sensations with swallowing  Hematological: Does not bruise/bleed easily.  Psychiatric/Behavioral: Negative for agitation, behavioral problems (depression) and hallucinations.    Vital Signs: BP 114/76   Pulse 88   Temp (!) 97.5 F (36.4 C)   Resp 16   Ht 5\' 1"  (1.549 m)   Wt 160 lb 9.6 oz (72.8 kg)   SpO2 99%   BMI 30.35 kg/m    Physical Exam Vitals reviewed.  Constitutional:      Appearance: Normal appearance.  Cardiovascular:     Rate and Rhythm: Normal rate and regular rhythm.     Pulses: Normal pulses.     Heart sounds: Normal heart sounds.  Pulmonary:     Effort: Pulmonary effort is normal.     Breath sounds: Normal breath sounds.  Abdominal:     General: Abdomen is flat.     Palpations: Abdomen is soft.  Musculoskeletal:        General: Normal range of motion.  Neurological:     General: No focal deficit present.     Mental Status: She is alert and oriented to person, place, and time. Mental status is at baseline.  Psychiatric:        Mood and Affect: Mood normal.        Behavior: Behavior normal.        Thought Content: Thought content normal.        Judgment: Judgment normal.    Assessment/Plan: 1. Facial neuralgia Start gabapentin, discussed how to titrate dosing as tolerated For severe pain will try low dose oxycodone as pain has started disrupting her  sleep Will continue with close follow-up and monitoring of sympotms - oxyCODONE (ROXICODONE) 5 MG immediate release tablet; Take 1 tablet (5 mg total) by mouth every 6 (six) hours as needed for severe pain.  Dispense: 30 tablet; Refill: 0 - gabapentin (NEURONTIN) 100 MG capsule; Take 1 tablet by mouth in the morning, take 2-3 tablets by mouth at bedtime.  Dispense: 120 capsule; Refill: 0  2. Vaginal candidiasis Treat with course of fluconazole - fluconazole (DIFLUCAN) 150 MG tablet; Take one tablet by mouth once, may repeat in three days if symptoms have not resolved.  Dispense: 3 tablet; Refill: 0  General Counseling: Pualani verbalizes understanding of the findings of todays visit and agrees with plan of treatment. I have discussed any further diagnostic evaluation that may be needed or ordered today. We  also reviewed her medications today. she has been encouraged to call the office with any questions or concerns that should arise related to todays visit.      Meds ordered this encounter  Medications  . oxyCODONE (ROXICODONE) 5 MG immediate release tablet    Sig: Take 1 tablet (5 mg total) by mouth every 6 (six) hours as needed for severe pain.    Dispense:  30 tablet    Refill:  0  . fluconazole (DIFLUCAN) 150 MG tablet    Sig: Take one tablet by mouth once, may repeat in three days if symptoms have not resolved.    Dispense:  3 tablet    Refill:  0  . gabapentin (NEURONTIN) 100 MG capsule    Sig: Take 1 tablet by mouth in the morning, take 2-3 tablets by mouth at bedtime.    Dispense:  120 capsule    Refill:  0    Time spent:30 Minutes Time spent includes review of chart, medications, test results and follow-up plan with the patient.  This patient was seen by Theodoro Grist AGNP-C in Collaboration with Dr Lavera Guise as a part of collaborative care agreement     Tanna Furry. Latera Mclin AGNP-C Internal medicine

## 2020-08-10 ENCOUNTER — Encounter: Payer: Self-pay | Admitting: Internal Medicine

## 2020-08-11 ENCOUNTER — Ambulatory Visit
Admission: RE | Admit: 2020-08-11 | Discharge: 2020-08-11 | Disposition: A | Discharge status: Home / Self Care | Payer: 59 | Source: Ambulatory Visit | Attending: Hospice and Palliative Medicine | Admitting: Hospice and Palliative Medicine

## 2020-08-11 ENCOUNTER — Ambulatory Visit (INDEPENDENT_AMBULATORY_CARE_PROVIDER_SITE_OTHER): Payer: 59 | Admitting: Hospice and Palliative Medicine

## 2020-08-11 ENCOUNTER — Other Ambulatory Visit: Payer: Self-pay

## 2020-08-11 ENCOUNTER — Ambulatory Visit (INDEPENDENT_AMBULATORY_CARE_PROVIDER_SITE_OTHER): Payer: 59

## 2020-08-11 ENCOUNTER — Telehealth: Payer: Self-pay

## 2020-08-11 ENCOUNTER — Encounter: Payer: Self-pay | Admitting: Hospice and Palliative Medicine

## 2020-08-11 ENCOUNTER — Other Ambulatory Visit: Payer: Self-pay | Admitting: Hospice and Palliative Medicine

## 2020-08-11 VITALS — HR 128 | Temp 97.6°F | Resp 16 | Ht 61.0 in | Wt 160.0 lb

## 2020-08-11 DIAGNOSIS — R200 Anesthesia of skin: Secondary | ICD-10-CM

## 2020-08-11 DIAGNOSIS — M792 Neuralgia and neuritis, unspecified: Secondary | ICD-10-CM

## 2020-08-11 DIAGNOSIS — R519 Headache, unspecified: Secondary | ICD-10-CM

## 2020-08-11 DIAGNOSIS — G500 Trigeminal neuralgia: Secondary | ICD-10-CM

## 2020-08-11 DIAGNOSIS — R000 Tachycardia, unspecified: Secondary | ICD-10-CM

## 2020-08-11 DIAGNOSIS — R079 Chest pain, unspecified: Secondary | ICD-10-CM | POA: Diagnosis not present

## 2020-08-11 NOTE — Telephone Encounter (Signed)
LMOM ADVISING PT SCHEDULED FOR CT CERVICAL ON 08/19/20 @ 9:30 MEBANE/TAT

## 2020-08-11 NOTE — Progress Notes (Signed)
[facility] [place], [place] [address]  Internal MEDICINE  Telephone Visit  Patient Name: Jamie Kennedy  [id number]  [id number]  Date of Service: 08/11/2020  I connected with the patient at [phone] by telephone and verified the patients identity using two identifiers.   I discussed the limitations, risks, security and privacy concerns of performing an evaluation and management service by telephone and the availability of in person appointments. I also discussed with the patient that there may be a patient responsible charge related to the service.  The patient expressed understanding and agrees to proceed.    Chief Complaint  Patient presents with  . Acute Visit    Cervical issues, possible bells palsy, heartrate has been up pt noticed over the last few days its somewhere between 100 to 130  . Anxiety  . Telephone Screen    Phone call  . Telephone Assessment    [phone]    HPI Patient is being seen today for sick visit She has been seen in our office over the last few months for neurological symptoms--diagnosed with Bells Palsy, symptoms have not resolved since initial diagnosis and have progressed  Today symptoms have progressed to more severe pain and pressure Pressure and pain is to the right side of her face extending to the back of her head to the base of the right side of her head There is a constant dull pain and pressure sensation in this area but pain and pressure is intensified when she lies down There has been associated numbness to same area but at this time pain is more prominent She continues to feel as though her swallowing is abnormal  Will occasionally have the pain and pressure sensation radiate to the left side of her face--describes this as a band like sensation of pain and pressure  MRI 9/29--normal CT sinuses 12/11--normal Has been evaluated by neurology--evaluation unremarkable  Also concerned today about her heart rate--when  she checked it this AM it was sustaining 120-130, has noticed her heart rate has been elevated over the last 2-3 days, highest noted heart rate 140 She did take her first dose of gabapentin last night for neuropathic pain related to her symptoms, she feels as though this somewhat relieved her pain, she was able to sleep a few hours without interruption Woke up at 6AM with dizziness     Current Medication: Outpatient Encounter Medications as of 08/11/2020  Medication Sig  . ALPRAZolam (NIRAVAM) 0.5 MG dissolvable tablet Take 1 tablet po BID prn anxiety  . ascorbic acid (VITAMIN C) 500 MG tablet Take 500 mg by mouth daily.  . busPIRone (BUSPAR) 10 MG tablet Take 1/2 to 1 tablet po BID prn anxiety  . fluconazole (DIFLUCAN) 150 MG tablet Take one tablet by mouth once, may repeat in three days if symptoms have not resolved.  . gabapentin (NEURONTIN) 100 MG capsule Take 1 tablet by mouth in the morning, take 2-3 tablets by mouth at bedtime.  . Melatonin 5 MG CAPS Take 5 mg by mouth at bedtime.  Marland Kitchen oxyCODONE (ROXICODONE) 5 MG immediate release tablet Take 1 tablet (5 mg total) by mouth every 6 (six) hours as needed for severe pain.  Marland Kitchen VITAMIN D PO Take 1 tablet by mouth daily.  . [DISCONTINUED] methylPREDNISolone (MEDROL) 4 MG TBPK tablet Take by mouth as directed for 6 days (Patient not taking: No sig reported)   No facility-administered encounter medications on file as of 08/11/2020.    Surgical History:  History reviewed. No pertinent surgical history.  Medical History: Past Medical History:  Diagnosis Date  . GAD (generalized anxiety disorder)     Family History: Family History  Problem Relation Age of Onset  . Arthritis/Rheumatoid Mother   . Anxiety disorder Brother   . Parkinson's disease Maternal Grandfather   . Dementia Maternal Grandfather     Social History   Socioeconomic History  . Marital status: Married    Spouse name: Not on file  . Number of children: Not on file   . Years of education: Not on file  . Highest education level: Not on file  Occupational History  . Not on file  Tobacco Use  . Smoking status: Former Smoker    Types: Cigarettes    Quit date: 04/07/2020    Years since quitting: 0.3  . Smokeless tobacco: Never Used  . Tobacco comment: smoke e-cig daily  Vaping Use  . Vaping Use: Never used  Substance and Sexual Activity  . Alcohol use: Never  . Drug use: Never  . Sexual activity: Yes    Birth control/protection: I.U.D.    Comment: Mirena  Other Topics Concern  . Not on file  Social History Narrative  . Not on file   Social Determinants of Health   Financial Resource Strain: Not on file  Food Insecurity: Not on file  Transportation Needs: Not on file  Physical Activity: Not on file  Stress: Not on file  Social Connections: Not on file  Intimate Partner Violence: Not on file    Review of Systems  Constitutional: Negative for chills, diaphoresis and fatigue.  HENT: Negative for ear pain, postnasal drip and sinus pressure.   Eyes: Negative for photophobia, discharge, redness, itching and visual disturbance.  Respiratory: Negative for cough, shortness of breath and wheezing.   Cardiovascular: Negative for chest pain, palpitations and leg swelling.  Gastrointestinal: Negative for abdominal pain, constipation, diarrhea, nausea and vomiting.  Genitourinary: Negative for dysuria and flank pain.  Musculoskeletal: Negative for arthralgias, back pain, gait problem and neck pain.  Skin: Negative for color change.  Allergic/Immunologic: Negative for environmental allergies and food allergies.  Neurological: Positive for dizziness and numbness. Negative for headaches.       Pain and pressure to right side of face radiating to back of head to base of skull Intermittent symptoms of numbness Band like radiation of pain to left side of face  Hematological: Does not bruise/bleed easily.  Psychiatric/Behavioral: Negative for agitation,  behavioral problems (depression) and hallucinations.    Vital Signs: Pulse (!) 128   Temp 97.6 F (36.4 C)   Resp 16   Ht 5\' 1"  (1.549 m)   Wt 160 lb (72.6 kg)   SpO2 98%   BMI 30.23 kg/m    Observation/Objective: She appears anxious and overwhelmed as this symptoms continue to progress without relief.   Assessment/Plan: 1. Facial pain Will obtain CT c-spine for further evaluation due to progression and worsening of symptoms, neurological symptoms of right sided facial pain, pressure and numbness Continue with gabapentin at this time for symptom management--may require neuro referral to [facility] - CT CERVICAL SPINE W WO CONTRAST; Future  2. Neuralgia Will obtain CT c-spine for further evaluation due to progression and worsening of symptoms, neurological symptoms of right sided facial pain, pressure and numbness Continue with gabapentin at this time for symptom management--may require neuro referral to [facility] - CT CERVICAL SPINE W WO CONTRAST; Future  3. Right facial numbness Will obtain CT c-spine for  further evaluation due to progression and worsening of symptoms, neurological symptoms of right sided facial pain, pressure and numbness Continue with gabapentin at this time for symptom management--may require neuro referral to [facility] - CT CERVICAL SPINE W WO CONTRAST; Future  4. Trigeminal neuralgia syndrome Will obtain CT c-spine for further evaluation due to progression and worsening of symptoms, neurological symptoms of right sided facial pain, pressure and numbness Continue with gabapentin at this time for symptom management--may require neuro referral to [facility] - CT CERVICAL SPINE W WO CONTRAST; Future  General Counseling: Britainy verbalizes understanding of the findings of today's phone visit and agrees with plan of treatment. I have discussed any further diagnostic evaluation that may be needed or ordered today. We also reviewed her medications today. she has been  encouraged to call the office with any questions or concerns that should arise related to todays visit.    Orders Placed This Encounter  Procedures  . CT CERVICAL SPINE W WO CONTRAST     Time spent: 30 Minutes Time spent includes review of chart, medications, test results and follow-up plan with the patient.  Lubertha Basque Castiel Lauricella AGNP-C Internal medicine

## 2020-08-12 ENCOUNTER — Other Ambulatory Visit: Payer: 59

## 2020-08-12 ENCOUNTER — Telehealth: Payer: Self-pay

## 2020-08-12 ENCOUNTER — Other Ambulatory Visit: Payer: Self-pay | Admitting: Hospice and Palliative Medicine

## 2020-08-12 LAB — COMPREHENSIVE METABOLIC PANEL
ALT: 10 IU/L (ref 0–32)
AST: 10 IU/L (ref 0–40)
Albumin/Globulin Ratio: 2 (ref 1.2–2.2)
Albumin: 4.3 g/dL (ref 3.8–4.8)
Alkaline Phosphatase: 61 IU/L (ref 44–121)
BUN/Creatinine Ratio: 6 — ABNORMAL LOW (ref 9–23)
BUN: 5 mg/dL — ABNORMAL LOW (ref 6–20)
Bilirubin Total: 0.2 mg/dL (ref 0.0–1.2)
CO2: 22 mmol/L (ref 20–29)
Calcium: 9.1 mg/dL (ref 8.7–10.2)
Chloride: 104 mmol/L (ref 96–106)
Creatinine, Ser: 0.78 mg/dL (ref 0.57–1.00)
GFR calc Af Amer: 115 mL/min/{1.73_m2} (ref >59)
GFR calc non Af Amer: 99 mL/min/{1.73_m2} (ref >59)
Globulin, Total: 2.2 g/dL (ref 1.5–4.5)
Glucose: 136 mg/dL — ABNORMAL HIGH (ref 65–99)
Potassium: 4 mmol/L (ref 3.5–5.2)
Sodium: 138 mmol/L (ref 134–144)
Total Protein: 6.5 g/dL (ref 6.0–8.5)

## 2020-08-12 LAB — CBC WITH DIFFERENTIAL/PLATELET
Basophils Absolute: 0 10*3/uL (ref 0.0–0.2)
Basos: 1 %
EOS (ABSOLUTE): 0 10*3/uL (ref 0.0–0.4)
Eos: 0 %
Hematocrit: 37.5 % (ref 34.0–46.6)
Hemoglobin: 13 g/dL (ref 11.1–15.9)
Immature Grans (Abs): 0 10*3/uL (ref 0.0–0.1)
Immature Granulocytes: 0 %
Lymphocytes Absolute: 0.5 10*3/uL — ABNORMAL LOW (ref 0.7–3.1)
Lymphs: 13 %
MCH: 30.7 pg (ref 26.6–33.0)
MCHC: 34.7 g/dL (ref 31.5–35.7)
MCV: 89 fL (ref 79–97)
Monocytes Absolute: 0.6 10*3/uL (ref 0.1–0.9)
Monocytes: 18 %
Neutrophils Absolute: 2.4 10*3/uL (ref 1.4–7.0)
Neutrophils: 68 %
Platelets: 176 10*3/uL (ref 150–450)
RBC: 4.23 x10E6/uL (ref 3.77–5.28)
RDW: 11.6 % — ABNORMAL LOW (ref 11.7–15.4)
WBC: 3.5 10*3/uL (ref 3.4–10.8)

## 2020-08-12 LAB — TSH+FREE T4
Free T4: 1.34 ng/dL (ref 0.82–1.77)
TSH: 0.858 u[IU]/mL (ref 0.450–4.500)

## 2020-08-12 MED ORDER — ALBUTEROL SULFATE HFA 108 (90 BASE) MCG/ACT IN AERS: 2.0000 | INHALATION_SPRAY | Freq: Four times a day (QID) | RESPIRATORY_TRACT | 0 refills | Status: DC | PRN

## 2020-08-12 MED ORDER — HYDROCOD POLST-CPM POLST ER 10-8 MG/5ML PO SUER
5.0000 mL | Freq: Every evening | ORAL | 0 refills | Status: DC | PRN
Start: 2020-08-12 — End: 2020-09-07

## 2020-08-12 NOTE — Telephone Encounter (Signed)
Pt called c/o testing positive for Covid last night and that she has been having pain in ears, a very bad cough , sore throat and voice almost gone.  Per taylor pt is to rest and increase fluids, take tylenol for pain, pt advised that we sent albuterol for SOB, and tussinex to only take at night for cough.  Pt informed to message taylor tomorrow or Friday to up date her on her symptoms.

## 2020-08-12 NOTE — Telephone Encounter (Signed)
Pt informed of her lab results being normal.  Pt also informed me that she tested positive for Covid last night.  Pt is having pain in both ears, sore throat, a cough that is infrequent but very hard, and her voice is almost gone.  Pt has had symptoms for several days and tested negative for covid  And then retested on 08/11/20 and was positive

## 2020-08-12 NOTE — Progress Notes (Signed)
Please let the patient know that her labs were normal. Thanks.

## 2020-08-12 NOTE — Telephone Encounter (Signed)
Spoke to pt about her normal lab results and also asked if she was able to get her xanax.  Pt informed me that she used [facility] and got it that way

## 2020-08-14 ENCOUNTER — Encounter: Payer: Self-pay | Admitting: Hospice and Palliative Medicine

## 2020-08-18 ENCOUNTER — Encounter: Payer: Self-pay | Admitting: Internal Medicine

## 2020-08-18 ENCOUNTER — Ambulatory Visit: Payer: 59 | Admitting: Internal Medicine

## 2020-08-18 ENCOUNTER — Other Ambulatory Visit: Payer: Self-pay

## 2020-08-18 VITALS — BP 122/72 | HR 88 | Resp 16 | Ht 61.0 in | Wt 157.0 lb

## 2020-08-18 DIAGNOSIS — M7918 Myalgia, other site: Secondary | ICD-10-CM

## 2020-08-18 DIAGNOSIS — M62838 Other muscle spasm: Secondary | ICD-10-CM

## 2020-08-18 MED ORDER — PREGABALIN 25 MG PO CAPS: ORAL_CAPSULE | ORAL | 1 refills | Status: DC

## 2020-08-18 MED ORDER — MELOXICAM 15 MG PO TABS: 15.0000 mg | ORAL_TABLET | Freq: Every day | ORAL | 1 refills | Status: DC

## 2020-08-18 MED ORDER — DULOXETINE HCL 20 MG PO CPEP: 20.0000 mg | ORAL_CAPSULE | Freq: Every day | ORAL | 3 refills | Status: DC

## 2020-08-18 MED ORDER — CYCLOBENZAPRINE HCL 10 MG PO TABS: ORAL_TABLET | ORAL | 1 refills | Status: DC

## 2020-08-18 NOTE — Progress Notes (Signed)
[facility] [address] [place], [place] [address]  Internal MEDICINE  Office Visit Note  Patient Name: Jamie Kennedy  [id number]  [id number]  Date of Service: 08/24/2020  Chief Complaint  Patient presents with  . Anxiety  . Neck Pain    HPI  Pt is here with ongoing symptoms of pain and numbness right side of the face, she also has neck pain. Recent MRI and CT C spine is negative for any acute pathology. Did show Broad-based reversal of normal lordosis may be due to positioning or muscle spasm. 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 Initial presentation was of Bell's palsy however she was unable to take prednisone. She continues to have worsening pain, waxing and waning, at times. She feels anxious about it, has intolerance to multiple medications  Current Medication: Outpatient Encounter Medications as of 08/18/2020  Medication Sig  . ALPRAZolam (NIRAVAM) 0.5 MG dissolvable tablet Take 1 tablet po BID prn anxiety  . ascorbic acid (VITAMIN C) 500 MG tablet Take 500 mg by mouth daily.  . cyclobenzaprine (FLEXERIL) 10 MG tablet Take one tab po qhs for neck pain  . DULoxetine (CYMBALTA) 20 MG capsule Take 1 capsule (20 mg total) by mouth daily.  Marland Kitchen gabapentin (NEURONTIN) 100 MG capsule Take 1 tablet by mouth in the morning, take 2-3 tablets by mouth at bedtime.  . Melatonin 5 MG CAPS Take 5 mg by mouth at bedtime.  . meloxicam (MOBIC) 15 MG tablet Take 1 tablet (15 mg total) by mouth daily. In am with breakfast  . pregabalin (LYRICA) 25 MG capsule Take one tab po bid x 5 days and then increase as tolerated (Patient taking differently: Take one tab po bid x 5 days and then increase as tolerated/maximum 3 a day)  . VITAMIN D PO Take 1 tablet by mouth daily.  Marland Kitchen albuterol (VENTOLIN HFA) 108 (90 Base) MCG/ACT inhaler Inhale 2 puffs into the lungs every 6 (six) hours as needed for wheezing or shortness of breath. (Patient not taking: Reported on  08/18/2020)  . busPIRone (BUSPAR) 10 MG tablet Take 1/2 to 1 tablet po BID prn anxiety (Patient not taking: Reported on 08/18/2020)  . chlorpheniramine-HYDROcodone (TUSSIONEX PENNKINETIC ER) 10-8 MG/5ML SUER Take 5 mLs by mouth at bedtime as needed for cough. (Patient not taking: Reported on 08/18/2020)  . oxyCODONE (ROXICODONE) 5 MG immediate release tablet Take 1 tablet (5 mg total) by mouth every 6 (six) hours as needed for severe pain. (Patient not taking: Reported on 08/18/2020)  . [DISCONTINUED] fluconazole (DIFLUCAN) 150 MG tablet Take one tablet by mouth once, may repeat in three days if symptoms have not resolved. (Patient not taking: Reported on 08/18/2020)   No facility-administered encounter medications on file as of 08/18/2020.    Surgical History: History reviewed. No pertinent surgical history.  Medical History: Past Medical History:  Diagnosis Date  . GAD (generalized anxiety disorder)     Family History: Family History  Problem Relation Age of Onset  . Arthritis/Rheumatoid Mother   . Anxiety disorder Brother   . Parkinson's disease Maternal Grandfather   . Dementia Maternal Grandfather     Social History   Socioeconomic History  . Marital status: Married    Spouse name: Not on file  . Number of children: Not on file  . Years of education: Not on file  . Highest education level: Not on file  Occupational History  . Not on file  Tobacco  Use  . Smoking status: Former Smoker    Types: Cigarettes    Quit date: 04/07/2020    Years since quitting: 0.3  . Smokeless tobacco: Never Used  . Tobacco comment: smoke e-cig daily  Vaping Use  . Vaping Use: Never used  Substance and Sexual Activity  . Alcohol use: Never  . Drug use: Never  . Sexual activity: Yes    Birth control/protection: I.U.D.    Comment: Mirena  Other Topics Concern  . Not on file  Social History Narrative  . Not on file   Social Determinants of Health   Financial Resource Strain: Not on file  Food  Insecurity: Not on file  Transportation Needs: Not on file  Physical Activity: Not on file  Stress: Not on file  Social Connections: Not on file  Intimate Partner Violence: Not on file      Review of Systems  Constitutional: Negative for chills, diaphoresis and fatigue.  HENT: Negative for ear pain, postnasal drip and sinus pressure.        Right sided pain and numbness   Eyes: Negative for photophobia, discharge, redness, itching and visual disturbance.  Respiratory: Negative for cough, shortness of breath and wheezing.   Cardiovascular: Negative for chest pain, palpitations and leg swelling.  Gastrointestinal: Negative for abdominal pain, constipation, diarrhea and nausea.  Musculoskeletal: Positive for neck pain and neck stiffness. Negative for arthralgias, back pain and gait problem.  Skin: Negative for color change.  Neurological: Positive for facial asymmetry, numbness and headaches. Negative for dizziness.  Hematological: Does not bruise/bleed easily.  Psychiatric/Behavioral: Negative for agitation, behavioral problems (depression) and hallucinations.    Vital Signs: BP 122/72   Pulse 88   Resp 16   Ht 5\' 1"  (1.549 m)   Wt 157 lb (71.2 kg)   SpO2 99%   BMI 29.66 kg/m    Physical Exam Constitutional:      Appearance: Normal appearance.     Comments: Anxious   HENT:     Nose: Nose normal.     Mouth/Throat:     Mouth: Mucous membranes are moist.     Pharynx: No oropharyngeal exudate.  Eyes:     Extraocular Movements: Extraocular movements intact.  Cardiovascular:     Rate and Rhythm: Normal rate and regular rhythm.  Pulmonary:     Effort: Pulmonary effort is normal.     Breath sounds: Normal breath sounds.  Skin:    General: Skin is warm and dry.  Neurological:     General: No focal deficit present.     Mental Status: She is alert.     Comments: Minimal facial Asymmetry   Psychiatric:     Comments: Anxious      Assessment/Plan: 1. Myofascial pain on  right side Lengthy discussion about different etiology of facial pain, have exhausted all resources at this time, will focus on symptoms control, stop Gabapentin  - DULoxetine (CYMBALTA) 20 MG capsule; Take 1 capsule (20 mg total) by mouth daily.  Dispense: 30 capsule; Refill: 3 - pregabalin (LYRICA) 25 MG capsule; Take one tab po bid x 5 days and then increase as tolerated (Patient taking differently: Take one tab po bid x 5 days and then increase as tolerated/maximum 3 a day)  Dispense: 120 capsule; Refill: 1  2. Muscle spasms of neck Anxiety control with prn use of Xanax  - cyclobenzaprine (FLEXERIL) 10 MG tablet; Take one tab po qhs for neck pain  Dispense: 30 tablet; Refill: - meloxicam (MOBIC)  15 MG tablet; Take 1 tablet (15 mg total) by mouth daily. In am with breakfast  Dispense: 30 tablet; Refill: 1  General Counseling: Jamie Kennedy verbalizes understanding of the findings of todays visit and agrees with plan of treatment. I have discussed any further diagnostic evaluation that may be needed or ordered today. We also reviewed her medications today. she has been encouraged to call the office with any questions or concerns that should arise related to todays visit.  Meds ordered this encounter  Medications  . cyclobenzaprine (FLEXERIL) 10 MG tablet    Sig: Take one tab po qhs for neck pain    Dispense:  30 tablet    Refill:  1  . DULoxetine (CYMBALTA) 20 MG capsule    Sig: Take 1 capsule (20 mg total) by mouth daily.    Dispense:  30 capsule    Refill:  3  . pregabalin (LYRICA) 25 MG capsule    Sig: Take one tab po bid x 5 days and then increase as tolerated    Dispense:  120 capsule    Refill:  1  . meloxicam (MOBIC) 15 MG tablet    Sig: Take 1 tablet (15 mg total) by mouth daily. In am with breakfast    Dispense:  30 tablet    Refill:  1    Total time spent:35Minutes Time spent includes review of chart, medications, test results, and follow up plan with the patient.      Dr  Lyndon Code Internal medicine

## 2020-08-19 ENCOUNTER — Other Ambulatory Visit: Payer: 59

## 2020-08-23 DIAGNOSIS — G500 Trigeminal neuralgia: Secondary | ICD-10-CM | POA: Insufficient documentation

## 2020-08-23 DIAGNOSIS — J324 Chronic pansinusitis: Secondary | ICD-10-CM | POA: Insufficient documentation

## 2020-08-25 NOTE — Telephone Encounter (Signed)
Send rx

## 2020-08-26 ENCOUNTER — Other Ambulatory Visit: Payer: Self-pay | Admitting: Internal Medicine

## 2020-08-26 DIAGNOSIS — M62838 Other muscle spasm: Secondary | ICD-10-CM

## 2020-08-26 MED ORDER — MELOXICAM 7.5 MG PO TABS: 15.0000 mg | ORAL_TABLET | Freq: Every day | ORAL | 3 refills | Status: DC

## 2020-08-26 MED ORDER — CYCLOBENZAPRINE HCL 5 MG PO TABS: ORAL_TABLET | ORAL | 3 refills | Status: DC

## 2020-09-02 ENCOUNTER — Other Ambulatory Visit: Payer: Self-pay | Admitting: Hospice and Palliative Medicine

## 2020-09-02 DIAGNOSIS — G518 Other disorders of facial nerve: Secondary | ICD-10-CM

## 2020-09-07 ENCOUNTER — Ambulatory Visit (INDEPENDENT_AMBULATORY_CARE_PROVIDER_SITE_OTHER): Payer: 59 | Admitting: Internal Medicine

## 2020-09-07 ENCOUNTER — Encounter: Payer: Self-pay | Admitting: Physician Assistant

## 2020-09-07 ENCOUNTER — Encounter: Payer: Self-pay | Admitting: Internal Medicine

## 2020-09-07 VITALS — BP 111/77 | HR 87 | Temp 98.0°F | Resp 16 | Ht 61.0 in | Wt 158.6 lb

## 2020-09-07 DIAGNOSIS — F411 Generalized anxiety disorder: Secondary | ICD-10-CM | POA: Diagnosis not present

## 2020-09-07 DIAGNOSIS — G518 Other disorders of facial nerve: Secondary | ICD-10-CM | POA: Diagnosis not present

## 2020-09-07 DIAGNOSIS — M7918 Myalgia, other site: Secondary | ICD-10-CM

## 2020-09-07 MED ORDER — PREGABALIN 100 MG PO CAPS: 100.0000 mg | ORAL_CAPSULE | Freq: Three times a day (TID) | ORAL | 2 refills | Status: DC

## 2020-09-07 MED ORDER — ALPRAZOLAM 0.25 MG PO TABS: 0.2500 mg | ORAL_TABLET | Freq: Two times a day (BID) | ORAL | 1 refills | Status: DC | PRN

## 2020-09-07 MED ORDER — PREGABALIN 50 MG PO CAPS: 50.0000 mg | ORAL_CAPSULE | Freq: Three times a day (TID) | ORAL | 1 refills | Status: DC

## 2020-09-07 NOTE — Progress Notes (Signed)
[facility] [place], [place] [address]  Internal MEDICINE  Office Visit Note  Patient Name: Jamie Kennedy  [id number]  [id number]  Date of Service: 09/08/2020  Chief Complaint  Patient presents with  . Follow-up  . Anxiety  . Facial Pain    HPI Pt is here with ongoing symptoms of pain and numbness right side of the face, she also has neck pain. She was started on Lyrica 50 mg bid, still takes Xanax with brief pain free period.  Recent MRI and CT C spine is negative for any acute pathology. Did show Broad-based reversal of normal lordosis may be due to positioning or muscle spasm. 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 Initial presentation was of Bell's palsy however she was unable to take prednisone. She continues to have worsening pain, waxing and waning, at times. She feels anxious about it, has intolerance to multiple medications She has not started her Cymbalta, will like her to get 2nd opinion    Current Medication: Outpatient Encounter Medications as of 09/07/2020  Medication Sig  . [DISCONTINUED] ALPRAZolam (XANAX) 0.25 MG tablet Take 1 tablet (0.25 mg total) by mouth 2 (two) times daily as needed for anxiety.  . [DISCONTINUED] pregabalin (LYRICA) 100 MG capsule Take 1 capsule (100 mg total) by mouth 3 (three) times daily.  . [DISCONTINUED] pregabalin (LYRICA) 50 MG capsule Take 1 capsule (50 mg total) by mouth 3 (three) times daily.  Marland Kitchen ascorbic acid (VITAMIN C) 500 MG tablet Take 500 mg by mouth daily.  . cyclobenzaprine (FLEXERIL) 5 MG tablet Take one tab po qhs for neck pain  . DULoxetine (CYMBALTA) 20 MG capsule Take 1 capsule (20 mg total) by mouth daily.  Marland Kitchen gabapentin (NEURONTIN) 100 MG capsule TAKE ONE CAPSULE BY MOUTH EVERY MORNING AND TAKE 2-3 CAPSULES BY MOUTH EVERY NIGHT AT BEDTIME  . Melatonin 5 MG CAPS Take 5 mg by mouth at bedtime.  . meloxicam (MOBIC) 7.5 MG tablet Take 2 tablets (15 mg total) by mouth  daily. In am with breakfast  . VITAMIN D PO Take 1 tablet by mouth daily.  . [DISCONTINUED] albuterol (VENTOLIN HFA) 108 (90 Base) MCG/ACT inhaler Inhale 2 puffs into the lungs every 6 (six) hours as needed for wheezing or shortness of breath. (Patient not taking: Reported on 08/18/2020)  . [DISCONTINUED] ALPRAZolam (NIRAVAM) 0.5 MG dissolvable tablet Take 1 tablet po BID prn anxiety  . [DISCONTINUED] busPIRone (BUSPAR) 10 MG tablet Take 1/2 to 1 tablet po BID prn anxiety (Patient not taking: Reported on 08/18/2020)  . [DISCONTINUED] chlorpheniramine-HYDROcodone (TUSSIONEX PENNKINETIC ER) 10-8 MG/5ML SUER Take 5 mLs by mouth at bedtime as needed for cough. (Patient not taking: Reported on 08/18/2020)  . [DISCONTINUED] oxyCODONE (ROXICODONE) 5 MG immediate release tablet Take 1 tablet (5 mg total) by mouth every 6 (six) hours as needed for severe pain. (Patient not taking: Reported on 08/18/2020)  . [DISCONTINUED] pregabalin (LYRICA) 100 MG capsule Take 1 capsule (100 mg total) by mouth 3 (three) times daily.  . [DISCONTINUED] pregabalin (LYRICA) 25 MG capsule Take one tab po bid x 5 days and then increase as tolerated (Patient taking differently: Take one tab po bid x 5 days and then increase as tolerated/maximum 3 a day)   No facility-administered encounter medications on file as of 09/07/2020.    Surgical History: History reviewed. No pertinent surgical history.  Medical History: Past Medical History:  Diagnosis Date  . GAD (generalized  anxiety disorder)     Family History: Family History  Problem Relation Age of Onset  . Arthritis/Rheumatoid Mother   . Anxiety disorder Brother   . Parkinson's disease Maternal Grandfather   . Dementia Maternal Grandfather     Social History   Socioeconomic History  . Marital status: Married    Spouse name: Not on file  . Number of children: Not on file  . Years of education: Not on file  . Highest education level: Not on file  Occupational History  .  Not on file  Tobacco Use  . Smoking status: Former Smoker    Types: Cigarettes    Quit date: 04/07/2020    Years since quitting: 0.4  . Smokeless tobacco: Never Used  . Tobacco comment: smoke e-cig daily  Vaping Use  . Vaping Use: Never used  Substance and Sexual Activity  . Alcohol use: Never  . Drug use: Never  . Sexual activity: Yes    Birth control/protection: I.U.D.    Comment: Mirena  Other Topics Concern  . Not on file  Social History Narrative  . Not on file   Social Determinants of Health   Financial Resource Strain: Not on file  Food Insecurity: Not on file  Transportation Needs: Not on file  Physical Activity: Not on file  Stress: Not on file  Social Connections: Not on file  Intimate Partner Violence: Not on file      Review of Systems  Constitutional: Negative for chills, diaphoresis and fatigue.  HENT: Negative for ear pain, postnasal drip and sinus pressure.   Eyes: Negative for photophobia, discharge, redness, itching and visual disturbance.  Respiratory: Negative for cough, shortness of breath and wheezing.   Cardiovascular: Negative for chest pain, palpitations and leg swelling.  Gastrointestinal: Negative for abdominal pain, constipation, diarrhea, nausea and vomiting.  Genitourinary: Negative for dysuria and flank pain.  Musculoskeletal: Negative for arthralgias, back pain, gait problem and neck pain.  Skin: Negative for color change.  Allergic/Immunologic: Negative for environmental allergies and food allergies.  Neurological: Positive for facial asymmetry. Negative for dizziness and headaches.       Pain right side   Hematological: Does not bruise/bleed easily.  Psychiatric/Behavioral: Negative for agitation, behavioral problems (depression) and hallucinations.    Vital Signs: BP 111/77   Pulse 87   Temp 98 F (36.7 C)   Resp 16   Ht 5\' 1"  (1.549 m)   Wt 158 lb 9.6 oz (71.9 kg)   SpO2 99%   BMI 29.97 kg/m    Physical  Exam Constitutional:      General: She is not in acute distress.    Appearance: She is well-developed. She is not diaphoretic.  HENT:     Head: Normocephalic and atraumatic.     Mouth/Throat:     Pharynx: No oropharyngeal exudate.  Eyes:     Pupils: Pupils are equal, round, and reactive to light.  Neck:     Thyroid: No thyromegaly.     Vascular: No JVD.     Trachea: No tracheal deviation.  Cardiovascular:     Rate and Rhythm: Normal rate and regular rhythm.     Heart sounds: Normal heart sounds. No murmur heard. No friction rub. No gallop.   Pulmonary:     Effort: Pulmonary effort is normal. No respiratory distress.     Breath sounds: No wheezing or rales.  Chest:     Chest wall: No tenderness.  Abdominal:     General: Bowel sounds  are normal.     Palpations: Abdomen is soft.  Musculoskeletal:        General: Normal range of motion.     Cervical back: Normal range of motion and neck supple.  Lymphadenopathy:     Cervical: No cervical adenopathy.  Skin:    General: Skin is warm and dry.  Neurological:     Mental Status: She is alert and oriented to person, place, and time.     Cranial Nerves: No cranial nerve deficit.  Psychiatric:        Behavior: Behavior normal.        Thought Content: Thought content normal.        Judgment: Judgment normal.     Assessment/Plan: 1. 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 GAD (generalized anxiety disorder) Continue to monitor   General Counseling: Swati verbalizes understanding of the findings of todays visit and agrees with plan of treatment. I have discussed any further diagnostic evaluation that may be needed or ordered today. We also reviewed her medications today. she has been encouraged to call the office with any questions or  concerns that should arise related to todays visit.  Orders Placed This Encounter  Procedures  . Ambulatory referral to Neurology    Meds ordered this encounter  Medications  . DISCONTD: pregabalin (LYRICA) 100 MG capsule    Sig: Take 1 capsule (100 mg total) by mouth 3 (three) times daily.    Dispense:  270 capsule    Refill:  2  . DISCONTD: pregabalin (LYRICA) 100 MG capsule    Sig: Take 1 capsule (100 mg total) by mouth 3 (three) times daily.    Dispense:  270 capsule    Refill:  2  . DISCONTD: ALPRAZolam (XANAX) 0.25 MG tablet    Sig: Take 1 tablet (0.25 mg total) by mouth 2 (two) times daily as needed for anxiety.    Dispense:  60 tablet    Refill:  1  . DISCONTD: pregabalin (LYRICA) 50 MG capsule    Sig: Take 1 capsule (50 mg total) by mouth 3 (three) times daily.    Dispense:  90 capsule    Refill:  1    Total time spent:40 Minutes Time spent includes review of chart, medications, test results, and follow up plan with the patient.      Dr Lavera Guise Internal medicine

## 2020-09-08 ENCOUNTER — Telehealth: Payer: Self-pay

## 2020-09-08 ENCOUNTER — Other Ambulatory Visit: Payer: Self-pay | Admitting: Internal Medicine

## 2020-09-08 MED ORDER — PREGABALIN 100 MG PO CAPS: 100.0000 mg | ORAL_CAPSULE | Freq: Three times a day (TID) | ORAL | 2 refills | Status: DC

## 2020-09-08 MED ORDER — PREGABALIN 50 MG PO CAPS: 50.0000 mg | ORAL_CAPSULE | Freq: Three times a day (TID) | ORAL | 1 refills | Status: DC

## 2020-09-08 MED ORDER — ALPRAZOLAM 0.25 MG PO TABS: 0.2500 mg | ORAL_TABLET | Freq: Two times a day (BID) | ORAL | 1 refills | Status: DC | PRN

## 2020-09-08 NOTE — Telephone Encounter (Signed)
Sent!

## 2020-09-09 ENCOUNTER — Other Ambulatory Visit: Payer: Self-pay | Admitting: Hospice and Palliative Medicine

## 2020-09-11 ENCOUNTER — Encounter: Payer: Self-pay | Admitting: Internal Medicine

## 2020-09-22 ENCOUNTER — Other Ambulatory Visit: Payer: Self-pay

## 2020-09-22 ENCOUNTER — Ambulatory Visit (INDEPENDENT_AMBULATORY_CARE_PROVIDER_SITE_OTHER): Payer: 59 | Admitting: Internal Medicine

## 2020-09-22 ENCOUNTER — Encounter: Payer: Self-pay | Admitting: Internal Medicine

## 2020-09-22 VITALS — BP 112/84 | HR 84 | Temp 97.9°F | Resp 16 | Ht 61.0 in | Wt 158.0 lb

## 2020-09-22 DIAGNOSIS — G509 Disorder of trigeminal nerve, unspecified: Secondary | ICD-10-CM | POA: Diagnosis not present

## 2020-09-22 DIAGNOSIS — F411 Generalized anxiety disorder: Secondary | ICD-10-CM | POA: Diagnosis not present

## 2020-09-22 DIAGNOSIS — M7918 Myalgia, other site: Secondary | ICD-10-CM

## 2020-09-22 NOTE — Progress Notes (Signed)
[facility] [place], [place] [address]  Internal MEDICINE  Office Visit Note  Patient Name: Jamie Kennedy  [id number]  [id number]  Date of Service: 09/22/2020  Chief Complaint  Patient presents with  . medication side effects    Taking Lyrica and feels the side effects are from it.  Having dizziness, headaches, tremors/convulsions through body and not tolerating getting to higher dose    HPI Pt is here for follow up for right sided numbness, pain and weakness. 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 She is scheduled to have MRI Brain with fiesta sequences rule out vascular compression or metastatic disease by neurology  Neurology note is reviewed for continuity of care She describes onset of numbness in the right face in August 2021. She describes numbness in the inside of her mouth (mainly) with numbness but no tingling initially. She reports this initially would come and go, lasting for hours at a time. She complains of a feeling of weakness in the right face. She was initially treated with prednisone and acyclovir but had tachycardia and was advised to stop these medications.  She continue to have the numbness in the right face intermittently but notes her feeling of numbness spread to the maxillary area. She was told her MRI Brain in September 2021 was normal.  She describes onset of "jaw pain again" in October 2021, and then reveals she had a "cold and sinus infection" in August 2021, followed by onset of right sided jaw pain. These symptoms occurred prior t to onset of the numbness in her right face. She also notes she quit smoking about the same time and was using an e cigarette. Her jaw pain recurrent in October 2021 along with a painful pressure throbbing sensation along the lower maxillar area. She also noted increased intensity of the numbness and painful tingling in the right face (points to V2 and V3 area). She  reports having an "odd" sensation in the right side of her tongue, with a change in sensation when she swallowed on the right. She also noted a throbbing pain in the hard palate on the right near the molars, with radiation toward the maxillary area. At times this throbbing pain was sharp or stabbing.  She has been prescribed Xanax and feels this decreases her symptoms when severe.  She has been prescribed a muscle relaxer and carbamazepine but did not take carbamazepine. She was given gabapentin but felt this was not helpful and had side effects. She was subsequently changed to pregabalin 50 mg bid, but is not sure if this helps at 50 mg three times a day.  she describes onset of an occipital constant headache that started in November 2021. She states this has been constantly present since onset.  She is taking Lyrica and alprazolam, willing to start Cymbalta   Current Medication: Outpatient Encounter Medications as of 09/22/2020  Medication Sig  . ALPRAZolam (XANAX) 0.25 MG tablet Take 1 tablet (0.25 mg total) by mouth 2 (two) times daily as needed for anxiety.  Marland Kitchen ascorbic acid (VITAMIN C) 500 MG tablet Take 500 mg by mouth daily.  . Melatonin 5 MG CAPS Take 5 mg by mouth at bedtime.  . meloxicam (MOBIC) 7.5 MG tablet Take 2 tablets (15 mg total) by mouth daily. In am with breakfast  . pregabalin (LYRICA) 100 MG capsule Take 1 capsule (100 mg total) by mouth 3 (three) times daily.  . pregabalin (LYRICA) 50  MG capsule Take 1 capsule (50 mg total) by mouth 3 (three) times daily.  Marland Kitchen VITAMIN D PO Take 1 tablet by mouth daily.  . cyclobenzaprine (FLEXERIL) 5 MG tablet Take one tab po qhs for neck pain (Patient not taking: Reported on 09/22/2020)  . DULoxetine (CYMBALTA) 20 MG capsule Take 1 capsule (20 mg total) by mouth daily. (Patient not taking: Reported on 09/22/2020)  . gabapentin (NEURONTIN) 100 MG capsule TAKE ONE CAPSULE BY MOUTH EVERY MORNING AND TAKE 2-3 CAPSULES BY MOUTH EVERY NIGHT AT BEDTIME  (Patient not taking: Reported on 09/22/2020)   No facility-administered encounter medications on file as of 09/22/2020.    Surgical History: History reviewed. No pertinent surgical history.  Medical History: Past Medical History:  Diagnosis Date  . GAD (generalized anxiety disorder)     Family History: Family History  Problem Relation Age of Onset  . Arthritis/Rheumatoid Mother   . Anxiety disorder Brother   . Parkinson's disease Maternal Grandfather   . Dementia Maternal Grandfather     Social History   Socioeconomic History  . Marital status: Married    Spouse name: Not on file  . Number of children: Not on file  . Years of education: Not on file  . Highest education level: Not on file  Occupational History  . Not on file  Tobacco Use  . Smoking status: Former Smoker    Types: Cigarettes    Quit date: 04/07/2020    Years since quitting: 0.4  . Smokeless tobacco: Never Used  . Tobacco comment: smoke e-cig daily  Vaping Use  . Vaping Use: Never used  Substance and Sexual Activity  . Alcohol use: Never  . Drug use: Never  . Sexual activity: Yes    Birth control/protection: I.U.D.    Comment: Mirena  Other Topics Concern  . Not on file  Social History Narrative  . Not on file   Social Determinants of Health   Financial Resource Strain: Not on file  Food Insecurity: Not on file  Transportation Needs: Not on file  Physical Activity: Not on file  Stress: Not on file  Social Connections: Not on file  Intimate Partner Violence: Not on file      Review of Systems  Constitutional: Negative for chills, diaphoresis and fatigue.  HENT: Negative for ear pain, postnasal drip and sinus pressure.   Eyes: Negative for photophobia, discharge, redness, itching and visual disturbance.  Respiratory: Negative for cough, shortness of breath and wheezing.   Cardiovascular: Negative for chest pain, palpitations and leg swelling.  Gastrointestinal: Negative for abdominal  pain, constipation, diarrhea, nausea and vomiting.  Genitourinary: Negative for dysuria and flank pain.  Musculoskeletal: Positive for neck stiffness. Negative for arthralgias, back pain, gait problem and neck pain.  Skin: Negative for color change.  Allergic/Immunologic: Negative for environmental allergies and food allergies.  Neurological: Positive for facial asymmetry, numbness and headaches. Negative for dizziness.  Hematological: Does not bruise/bleed easily.  Psychiatric/Behavioral: Negative for agitation, behavioral problems (depression) and hallucinations. The patient is nervous/anxious.     Vital Signs: BP 112/84   Pulse 84   Temp 97.9 F (36.6 C)   Resp 16   Ht 5\' 1"  (1.549 m)   Wt 158 lb (71.7 kg)   SpO2 98%   BMI 29.85 kg/m    Physical Exam Constitutional:      Appearance: Normal appearance.  HENT:     Head: Normocephalic and atraumatic.     Mouth/Throat:     Mouth: Mucous  membranes are moist.     Comments: Slight facial asymmetry  Cardiovascular:     Rate and Rhythm: Normal rate and regular rhythm.  Pulmonary:     Effort: Pulmonary effort is normal.     Breath sounds: Normal breath sounds.  Skin:    General: Skin is warm and dry.  Neurological:     Mental Status: She is alert.     Cranial Nerves: Cranial nerve deficit present.     Sensory: Sensory deficit present.      Assessment/Plan: 1. Myofascial pain on right side Will continue to monitor along with Neurology, reassurance is provided   2. Trigeminal nerve disease or syndrome Continue Lyrica and Cymbalta, might need Carbamazepine  3. GAD (generalized anxiety disorder) Prn use of alprazolam   General Counseling: Chivon verbalizes understanding of the findings of todays visit and agrees with plan of treatment. I have discussed any further diagnostic evaluation that may be needed or ordered today. We also reviewed her medications today. she has been encouraged to call the office with any questions  or concerns that should arise related to todays visit.    Total time spent:30 Minutes Time spent includes review of chart, medications, test results, and follow up plan with the patient.   [place] Controlled Substance Database was reviewed by me for overdose risk score (ORS)   Dr Lavera Guise Internal medicine

## 2020-10-12 ENCOUNTER — Encounter: Payer: Self-pay | Admitting: Internal Medicine

## 2020-10-12 NOTE — Telephone Encounter (Signed)
See

## 2020-10-28 ENCOUNTER — Other Ambulatory Visit: Payer: Self-pay | Admitting: Internal Medicine

## 2020-10-28 DIAGNOSIS — M62838 Other muscle spasm: Secondary | ICD-10-CM

## 2020-11-04 ENCOUNTER — Encounter: Payer: Self-pay | Admitting: Internal Medicine

## 2020-12-21 ENCOUNTER — Encounter: Payer: Self-pay | Admitting: Internal Medicine

## 2020-12-21 ENCOUNTER — Other Ambulatory Visit: Payer: Self-pay | Admitting: Internal Medicine

## 2020-12-21 MED ORDER — PREGABALIN 50 MG PO CAPS: 50.0000 mg | ORAL_CAPSULE | Freq: Three times a day (TID) | ORAL | 1 refills | Status: DC

## 2021-01-05 ENCOUNTER — Ambulatory Visit: Payer: 59 | Admitting: Hospice and Palliative Medicine

## 2021-01-19 ENCOUNTER — Ambulatory Visit: Payer: 59 | Admitting: Internal Medicine

## 2021-02-22 ENCOUNTER — Encounter: Payer: Self-pay | Admitting: Physician Assistant

## 2021-02-22 ENCOUNTER — Other Ambulatory Visit: Payer: Self-pay

## 2021-02-22 ENCOUNTER — Ambulatory Visit (INDEPENDENT_AMBULATORY_CARE_PROVIDER_SITE_OTHER): Payer: 59 | Admitting: Physician Assistant

## 2021-02-22 DIAGNOSIS — Z0001 Encounter for general adult medical examination with abnormal findings: Secondary | ICD-10-CM

## 2021-02-22 DIAGNOSIS — F411 Generalized anxiety disorder: Secondary | ICD-10-CM | POA: Diagnosis not present

## 2021-02-22 DIAGNOSIS — E669 Obesity, unspecified: Secondary | ICD-10-CM

## 2021-02-22 DIAGNOSIS — M7918 Myalgia, other site: Secondary | ICD-10-CM

## 2021-02-22 DIAGNOSIS — K219 Gastro-esophageal reflux disease without esophagitis: Secondary | ICD-10-CM

## 2021-02-22 DIAGNOSIS — G500 Trigeminal neuralgia: Secondary | ICD-10-CM

## 2021-02-22 MED ORDER — OMEPRAZOLE 40 MG PO CPDR: 40.0000 mg | DELAYED_RELEASE_CAPSULE | Freq: Every day | ORAL | 3 refills | Status: DC

## 2021-02-22 NOTE — Progress Notes (Signed)
[facility] [place],  [id number]  Internal MEDICINE  Office Visit Note  Patient Name: Jamie Kennedy  [id number]  [id number]  Date of Service: 02/23/2021  Chief Complaint  Patient presents with   Annual Exam    GI issues, discuss meds   Anxiety     HPI Pt is here for routine health maintenance examination -Sees neurologist and orofacial pain specialist at [facility] for TMJ and TN on Right side. TN seems better now. Consider coming off lyrica? Takes 50mg  BID and if she misses a dose has not noticed a difference in pain. -R masseter/R upper back teeth pain is still a problem. Has xray of teeth showing mandible shifting. -Takes meloxicam frequently, but stopped recently, has GERD now and would like to start PPI. -PT and chiropactor mentioned concern for EDS--ehlers danlos, arthritis and double jointed and ankylosing spondylitis in family and was wondering if there is any testing for this or if she would need to see a geneticist. Does get achey joints herself. -interested in seeing a TMJ specialist. Found Dr. Audrie Gallus in [place] who has dentist associated. Is going to call office to try to establish care and will contact office if referral needed. States her orofacial specialist is really managing pain and would like to discuss other options with TMJ specialist -klonopin .25 now once per day in evening, occasionally takes 1 of the .25 xanax maybe earlier in day but is careful to space them at least 8 hours apart -pap done in 2020, repeat 2025  Current Medication: Outpatient Encounter Medications as of 02/22/2021  Medication Sig   ALPRAZolam (XANAX) 0.25 MG tablet Take 1 tablet (0.25 mg total) by mouth 2 (two) times daily as needed for anxiety.   ascorbic acid (VITAMIN C) 500 MG tablet Take 500 mg by mouth daily.   clonazePAM (KLONOPIN) 1 MG tablet Take 1/2 tablet to 1 tablet 2 hours before bedtime   Melatonin 5 MG CAPS Take 5 mg by mouth at bedtime.   meloxicam  (MOBIC) 7.5 MG tablet TAKE 2 TABLETS(15 MG) BY MOUTH DAILY IN THE MORNING WITH BREAKFAST   omeprazole (PRILOSEC) 40 MG capsule Take 1 capsule (40 mg total) by mouth daily.   pregabalin (LYRICA) 50 MG capsule Take 1 capsule (50 mg total) by mouth 3 (three) times daily.   VITAMIN D PO Take 1 tablet by mouth daily.   [DISCONTINUED] gabapentin (NEURONTIN) 100 MG capsule TAKE ONE CAPSULE BY MOUTH EVERY MORNING AND TAKE 2-3 CAPSULES BY MOUTH EVERY NIGHT AT BEDTIME (Patient not taking: Reported on 09/22/2020)   No facility-administered encounter medications on file as of 02/22/2021.    Surgical History: History reviewed. No pertinent surgical history.  Medical History: Past Medical History:  Diagnosis Date   GAD (generalized anxiety disorder)    TMJ syndrome    Trigeminal neuralgia of right side of face     Family History: Family History  Problem Relation Age of Onset   Arthritis/Rheumatoid Mother    Anxiety disorder Brother    Parkinson's disease Maternal Grandfather    Dementia Maternal Grandfather       Review of Systems  Constitutional:  Negative for chills, fatigue and unexpected weight change.  HENT:  Positive for dental problem. Negative for congestion, postnasal drip, rhinorrhea, sneezing and sore throat.        TMJ and TN on right side  Eyes:  Negative for redness.  Respiratory:  Negative for cough, chest tightness and shortness of breath.  Cardiovascular:  Negative for chest pain and palpitations.  Gastrointestinal:  Negative for abdominal pain, constipation, diarrhea, nausea and vomiting.       Reflux  Genitourinary:  Negative for dysuria and frequency.  Musculoskeletal:  Positive for arthralgias. Negative for back pain, joint swelling and neck pain.  Skin:  Negative for rash.  Neurological: Negative.  Negative for tremors and numbness.       Right side TN improved, no longer sharp/shooting pain  Hematological:  Negative for adenopathy. Does not bruise/bleed easily.   Psychiatric/Behavioral:  Positive for sleep disturbance. Negative for behavioral problems (Depression) and suicidal ideas. The patient is nervous/anxious.     Vital Signs: BP 110/70   Pulse 74   Temp (!) 97.4 F (36.3 C)   Resp 16   Ht 5\' 1"  (1.549 m)   Wt 169 lb 3.2 oz (76.7 kg)   SpO2 99%   BMI 31.97 kg/m    Physical Exam Vitals and nursing note reviewed.  Constitutional:      General: She is not in acute distress.    Appearance: She is well-developed. She is obese. She is not diaphoretic.  HENT:     Head: Normocephalic and atraumatic.     Mouth/Throat:     Pharynx: No oropharyngeal exudate.     Comments: R side TMJ, tender along right side of face and jaw Eyes:     Pupils: Pupils are equal, round, and reactive to light.  Neck:     Thyroid: No thyromegaly.     Vascular: No JVD.     Trachea: No tracheal deviation.  Cardiovascular:     Rate and Rhythm: Normal rate and regular rhythm.     Heart sounds: Normal heart sounds. No murmur heard.   No friction rub. No gallop.  Pulmonary:     Effort: Pulmonary effort is normal. No respiratory distress.     Breath sounds: No wheezing or rales.  Chest:     Chest wall: No tenderness.  Breasts:    Right: Normal. No mass.     Left: Normal. No mass.  Abdominal:     General: Bowel sounds are normal.     Palpations: Abdomen is soft.  Musculoskeletal:        General: Normal range of motion.     Cervical back: Normal range of motion and neck supple.  Lymphadenopathy:     Cervical: No cervical adenopathy.  Skin:    General: Skin is warm and dry.  Neurological:     Mental Status: She is alert and oriented to person, place, and time.     Cranial Nerves: No cranial nerve deficit.  Psychiatric:        Behavior: Behavior normal.        Thought Content: Thought content normal.        Judgment: Judgment normal.     LABS: No results found for this or any previous visit (from the past 2160  hour(s)).      Assessment/Plan: 1. Encounter for general adult medical examination with abnormal findings Had labs done recently in December, including inflammatory markers. Pap due 2025.  2. Myofascial pain on right side Followed by orofacial pain specialist and looking to establish with TMJ specialist possibly in [place]--will call if referral needed  3. Trigeminal neuralgia syndrome Interested in coming off lyrica since TN seems to have improved and is mainly pain from TMJ now  4. GAD (generalized anxiety disorder) May continue xanax as needed  5. Gastroesophageal reflux disease without  esophagitis Will start omeprazole, especially if taking NSAID - omeprazole (PRILOSEC) 40 MG capsule; Take 1 capsule (40 mg total) by mouth daily.  Dispense: 30 capsule; Refill: 3  6. Obesity (BMI 30.0-34.9) Obesity Counseling: Had a lengthy discussion regarding patients BMI and weight issues. Patient was instructed on portion control as well as increased activity. Also discussed caloric restrictions with trying to maintain intake less than 2000 Kcal. Discussions were made in accordance with the 5As of weight management. Simple actions such as not eating late and if able to, taking a walk is suggested.    General Counseling: Robi verbalizes understanding of the findings of todays visit and agrees with plan of treatment. I have discussed any further diagnostic evaluation that may be needed or ordered today. We also reviewed her medications today. she has been encouraged to call the office with any questions or concerns that should arise related to todays visit.    Counseling:    No orders of the defined types were placed in this encounter.   Meds ordered this encounter  Medications   omeprazole (PRILOSEC) 40 MG capsule    Sig: Take 1 capsule (40 mg total) by mouth daily.    Dispense:  30 capsule    Refill:  3    This patient was seen by Drema Dallas, PA-C in collaboration with Dr.  Clayborn Bigness as a part of collaborative care agreement.  Total time spent:40 Minutes  Time spent includes review of chart, medications, test results, and follow up plan with the patient.     Lavera Guise, MD  Internal Medicine

## 2021-02-26 ENCOUNTER — Encounter: Payer: Self-pay | Admitting: Physician Assistant

## 2021-03-01 LAB — BASIC METABOLIC PANEL: Glucose: 90

## 2021-03-02 LAB — COMPREHENSIVE METABOLIC PANEL
ALT: 8 IU/L (ref 0–32)
AST: 9 IU/L (ref 0–40)
Albumin/Globulin Ratio: 2.2 (ref 1.2–2.2)
Albumin: 4.1 g/dL (ref 3.8–4.8)
Alkaline Phosphatase: 67 IU/L (ref 44–121)
BUN/Creatinine Ratio: 11 (ref 9–23)
BUN: 9 mg/dL (ref 6–20)
Bilirubin Total: 0.5 mg/dL (ref 0.0–1.2)
CO2: 21 mmol/L (ref 20–29)
Calcium: 9.1 mg/dL (ref 8.7–10.2)
Chloride: 104 mmol/L (ref 96–106)
Creatinine, Ser: 0.84 mg/dL (ref 0.57–1.00)
Globulin, Total: 1.9 g/dL (ref 1.5–4.5)
Glucose: 90 mg/dL (ref 65–99)
Potassium: 4.1 mmol/L (ref 3.5–5.2)
Sodium: 139 mmol/L (ref 134–144)
Total Protein: 6 g/dL (ref 6.0–8.5)
eGFR: 93 mL/min/{1.73_m2} (ref >59)

## 2021-03-02 LAB — CBC WITH DIFFERENTIAL/PLATELET
Basophils Absolute: 0 10*3/uL (ref 0.0–0.2)
Basos: 1 %
EOS (ABSOLUTE): 0.1 10*3/uL (ref 0.0–0.4)
Eos: 1 %
Hematocrit: 39.1 % (ref 34.0–46.6)
Hemoglobin: 13.4 g/dL (ref 11.1–15.9)
Immature Grans (Abs): 0 10*3/uL (ref 0.0–0.1)
Immature Granulocytes: 0 %
Lymphocytes Absolute: 2.2 10*3/uL (ref 0.7–3.1)
Lymphs: 34 %
MCH: 30 pg (ref 26.6–33.0)
MCHC: 34.3 g/dL (ref 31.5–35.7)
MCV: 88 fL (ref 79–97)
Monocytes Absolute: 0.4 10*3/uL (ref 0.1–0.9)
Monocytes: 6 %
Neutrophils Absolute: 3.8 10*3/uL (ref 1.4–7.0)
Neutrophils: 58 %
Platelets: 211 10*3/uL (ref 150–450)
RBC: 4.46 x10E6/uL (ref 3.77–5.28)
RDW: 11.9 % (ref 11.7–15.4)
WBC: 6.5 10*3/uL (ref 3.4–10.8)

## 2021-03-02 LAB — LIPID PANEL WITH LDL/HDL RATIO
Cholesterol, Total: 137 mg/dL (ref 100–199)
HDL: 44 mg/dL (ref >39)
LDL Chol Calc (NIH): 77 mg/dL (ref 0–99)
LDL/HDL Ratio: 1.8 ratio (ref 0.0–3.2)
Triglycerides: 82 mg/dL (ref 0–149)
VLDL Cholesterol Cal: 16 mg/dL (ref 5–40)

## 2021-03-02 LAB — T4, FREE: Free T4: 1.18 ng/dL (ref 0.82–1.77)

## 2021-03-02 LAB — TSH: TSH: 1.88 u[IU]/mL (ref 0.450–4.500)

## 2021-03-05 ENCOUNTER — Telehealth: Payer: Self-pay

## 2021-03-05 DIAGNOSIS — M791 Myalgia, unspecified site: Secondary | ICD-10-CM | POA: Insufficient documentation

## 2021-03-05 DIAGNOSIS — M542 Cervicalgia: Secondary | ICD-10-CM | POA: Insufficient documentation

## 2021-03-05 NOTE — Telephone Encounter (Signed)
Mailed pt 2 articles on Ehlers danlos syndrome per Ander Purpura.

## 2021-03-28 ENCOUNTER — Other Ambulatory Visit: Payer: Self-pay

## 2021-03-28 ENCOUNTER — Encounter: Payer: Self-pay | Admitting: Internal Medicine

## 2021-04-14 ENCOUNTER — Encounter: Payer: Self-pay | Admitting: Physician Assistant

## 2021-04-16 ENCOUNTER — Other Ambulatory Visit: Payer: Self-pay | Admitting: Internal Medicine

## 2021-04-16 DIAGNOSIS — M7918 Myalgia, other site: Secondary | ICD-10-CM

## 2021-04-16 DIAGNOSIS — K219 Gastro-esophageal reflux disease without esophagitis: Secondary | ICD-10-CM

## 2021-04-16 DIAGNOSIS — G500 Trigeminal neuralgia: Secondary | ICD-10-CM

## 2021-04-16 DIAGNOSIS — E669 Obesity, unspecified: Secondary | ICD-10-CM

## 2021-04-16 DIAGNOSIS — Z0001 Encounter for general adult medical examination with abnormal findings: Secondary | ICD-10-CM

## 2021-04-16 DIAGNOSIS — F411 Generalized anxiety disorder: Secondary | ICD-10-CM

## 2021-04-26 ENCOUNTER — Ambulatory Visit: Payer: 59 | Admitting: Internal Medicine

## 2021-04-26 ENCOUNTER — Other Ambulatory Visit: Payer: Self-pay

## 2021-04-26 ENCOUNTER — Encounter: Payer: Self-pay | Admitting: Internal Medicine

## 2021-04-26 VITALS — BP 100/70 | HR 78 | Temp 98.3°F | Resp 16 | Ht 61.0 in | Wt 171.4 lb

## 2021-04-26 DIAGNOSIS — G500 Trigeminal neuralgia: Secondary | ICD-10-CM | POA: Diagnosis not present

## 2021-04-26 DIAGNOSIS — K219 Gastro-esophageal reflux disease without esophagitis: Secondary | ICD-10-CM | POA: Diagnosis not present

## 2021-04-26 DIAGNOSIS — G479 Sleep disorder, unspecified: Secondary | ICD-10-CM

## 2021-04-26 NOTE — Progress Notes (Signed)
[facility] [place], [place] [address]  Internal MEDICINE  Office Visit Note  Patient Name: Jamie Kennedy  [id number]  [id number]  Date of Service: 04/26/2021  Chief Complaint  Patient presents with   testing requested    Requesting H-Pylori test due to pt having really bad GERD and her husband tested positive for H-Pylori a few weeks ago and is being treated    HPI  Patient is here for an acute visit. -Her husband is easily diagnosed with H. pylori infection by urgent care and was started on triple therapy, patient thinks she might need to be treated as well she does have symptoms of GERD and bloating she took omeprazole only for few days has not been taking it anymore -Her other problem continues to be right-sided jaw pain which initially was thought to be related to Bell's palsy, now might be TMJ, or due to malfunction in her teeth clenching,or  bruxism -Patient has been evaluated by multiple physicians.  patient might have a very small airway leading to  upper airway obstruction in her nasopharynx leading to sleep apnea and has been recommended that patient will or might get benefit from a sleep study.  However patient thinks that she will be unable to sleep in the sleep lab and request a home sleep study. Current Medication: Outpatient Encounter Medications as of 04/26/2021  Medication Sig   ascorbic acid (VITAMIN C) 500 MG tablet Take 500 mg by mouth daily.   clonazePAM (KLONOPIN) 1 MG tablet Take 1/2 tablet to 1 tablet 2 hours before bedtime   Melatonin 5 MG CAPS Take 5 mg by mouth at bedtime.   pregabalin (LYRICA) 50 MG capsule Take 1 capsule (50 mg total) by mouth 3 (three) times daily.   VITAMIN D PO Take 1 tablet by mouth daily.   [DISCONTINUED] ALPRAZolam (XANAX) 0.25 MG tablet Take 1 tablet (0.25 mg total) by mouth 2 (two) times daily as needed for anxiety.   [DISCONTINUED] baclofen (LIORESAL) 10 MG tablet Take by mouth. (Patient not taking:  Reported on 04/26/2021)   [DISCONTINUED] meloxicam (MOBIC) 7.5 MG tablet TAKE 2 TABLETS(15 MG) BY MOUTH DAILY IN THE MORNING WITH BREAKFAST (Patient not taking: Reported on 04/26/2021)   [DISCONTINUED] omeprazole (PRILOSEC) 40 MG capsule Take 1 capsule (40 mg total) by mouth daily. (Patient not taking: Reported on 04/26/2021)   No facility-administered encounter medications on file as of 04/26/2021.    Surgical History: History reviewed. No pertinent surgical history.  Medical History: Past Medical History:  Diagnosis Date   GAD (generalized anxiety disorder)    TMJ syndrome    Trigeminal neuralgia of right side of face     Family History: Family History  Problem Relation Age of Onset   Arthritis/Rheumatoid Mother    Anxiety disorder Brother    Parkinson's disease Maternal Grandfather    Dementia Maternal Grandfather     Social History   Socioeconomic History   Marital status: Married    Spouse name: Not on file   Number of children: Not on file   Years of education: Not on file   Highest education level: Not on file  Occupational History   Not on file  Tobacco Use   Smoking status: Former    Types: Cigarettes    Quit date: 04/07/2020    Years since quitting: 1.0   Smokeless tobacco: Never   Tobacco comments:    smoke e-cig daily  Vaping Use   Vaping Use: Never used  Substance and Sexual Activity   Alcohol use: Never   Drug use: Never   Sexual activity: Yes    Birth control/protection: I.U.D.    Comment: Mirena  Other Topics Concern   Not on file  Social History Narrative   Not on file   Social Determinants of Health   Financial Resource Strain: Not on file  Food Insecurity: Not on file  Transportation Needs: Not on file  Physical Activity: Not on file  Stress: Not on file  Social Connections: Not on file  Intimate Partner Violence: Not on file      Review of Systems  Constitutional:  Negative for fatigue and fever.  HENT:  Negative for congestion,  mouth sores and postnasal drip.   Respiratory:  Negative for cough.   Cardiovascular:  Negative for chest pain.  Gastrointestinal:  Positive for abdominal distention.  Genitourinary:  Negative for flank pain.  Psychiatric/Behavioral: Negative.     Vital Signs: BP 100/70   Pulse 78   Temp 98.3 F (36.8 C)   Resp 16   Ht '5\' 1"'$  (1.549 m)   Wt 171 lb 6.4 oz (77.7 kg)   SpO2 97%   BMI 32.39 kg/m    Physical Exam Constitutional:      Appearance: Normal appearance.  Cardiovascular:     Rate and Rhythm: Normal rate and regular rhythm.  Pulmonary:     Effort: Pulmonary effort is normal.     Breath sounds: Normal breath sounds.  Neurological:     Mental Status: She is alert.       Assessment/Plan: 1. Gastroesophageal reflux disease without esophagitis Patient instructed to continue taking omeprazole over-the-counter 40 mg once a day H. pylori labs have been ordered already patient will go to the lab and have them drawn she might need to see GI for possible EGD if indicated  2. Trigeminal neuralgia syndrome The symptoms are somewhat under control patient continues to take Lyrica as before  3. Sleep disturbance Patient has  symptoms of obstructive sleep apnea, snoring, nasopharynx obstruction on direct visualization and a small airway, will order sleep study on her  General Counseling: Jamie Kennedy verbalizes understanding of the findings of todays visit and agrees with plan of treatment. I have discussed any further diagnostic evaluation that may be needed or ordered today. We also reviewed her medications today. she has been encouraged to call the office with any questions or concerns that should arise related to todays visit.    Orders Placed This Encounter  Procedures   Home sleep test    No orders of the defined types were placed in this encounter.   Total time spent:25 Minutes Time spent includes review of chart, medications, test results, and follow up plan with the  patient.   [place] Controlled Substance Database was reviewed by me.   Dr Lavera Guise Internal medicine

## 2021-05-25 ENCOUNTER — Ambulatory Visit: Payer: 59 | Admitting: Internal Medicine

## 2021-06-01 ENCOUNTER — Encounter (INDEPENDENT_AMBULATORY_CARE_PROVIDER_SITE_OTHER): Payer: 59 | Admitting: Internal Medicine

## 2021-06-01 DIAGNOSIS — G4719 Other hypersomnia: Secondary | ICD-10-CM

## 2021-06-02 ENCOUNTER — Encounter (INDEPENDENT_AMBULATORY_CARE_PROVIDER_SITE_OTHER): Payer: 59 | Admitting: Internal Medicine

## 2021-06-02 DIAGNOSIS — G4719 Other hypersomnia: Secondary | ICD-10-CM

## 2021-06-08 ENCOUNTER — Telehealth: Payer: Self-pay

## 2021-06-08 NOTE — Telephone Encounter (Signed)
Patient was scheduled a home sleep study with feeling great for Tuesday October 18th, 2022.

## 2021-06-11 ENCOUNTER — Telehealth: Payer: Self-pay

## 2021-06-11 ENCOUNTER — Encounter: Payer: Self-pay | Admitting: Physician Assistant

## 2021-06-11 ENCOUNTER — Other Ambulatory Visit: Payer: Self-pay | Admitting: Physician Assistant

## 2021-06-11 DIAGNOSIS — M792 Neuralgia and neuritis, unspecified: Secondary | ICD-10-CM

## 2021-06-11 DIAGNOSIS — G509 Disorder of trigeminal nerve, unspecified: Secondary | ICD-10-CM

## 2021-06-11 MED ORDER — PREDNISONE 10 MG PO TABS: ORAL_TABLET | ORAL | 0 refills | Status: DC

## 2021-06-11 MED ORDER — FAMCICLOVIR 500 MG PO TABS: 500.0000 mg | ORAL_TABLET | Freq: Two times a day (BID) | ORAL | 0 refills | Status: DC

## 2021-06-11 NOTE — Telephone Encounter (Signed)
Lauren will sent pt a message, providers are aware of pt's call and concerns

## 2021-06-14 ENCOUNTER — Encounter: Payer: Self-pay | Admitting: Physician Assistant

## 2021-06-14 ENCOUNTER — Ambulatory Visit (INDEPENDENT_AMBULATORY_CARE_PROVIDER_SITE_OTHER): Payer: 59 | Admitting: Physician Assistant

## 2021-06-14 ENCOUNTER — Other Ambulatory Visit: Payer: Self-pay

## 2021-06-14 DIAGNOSIS — M7918 Myalgia, other site: Secondary | ICD-10-CM | POA: Diagnosis not present

## 2021-06-14 DIAGNOSIS — G500 Trigeminal neuralgia: Secondary | ICD-10-CM

## 2021-06-14 DIAGNOSIS — Z23 Encounter for immunization: Secondary | ICD-10-CM

## 2021-06-14 DIAGNOSIS — K219 Gastro-esophageal reflux disease without esophagitis: Secondary | ICD-10-CM | POA: Diagnosis not present

## 2021-06-14 DIAGNOSIS — G4719 Other hypersomnia: Secondary | ICD-10-CM | POA: Insufficient documentation

## 2021-06-14 MED ORDER — OMEPRAZOLE 20 MG PO CPDR: 20.0000 mg | DELAYED_RELEASE_CAPSULE | Freq: Every day | ORAL | 3 refills | Status: DC

## 2021-06-14 NOTE — Procedures (Signed)
[place]  Portable Polysomnogram Report Part 1 Phone: [phone] Fax: [phone]  Patient Name: Jamie Kennedy, Jamie Kennedy Recording Device: Glee Arvin  D.O.B.: 1985/12/22 Acquisition Number: [id number]  Referring Physician: Devona Konig, MD Acquisition Date: 06/02/2021   History: The patient is a 35 years old female who was referred for re-evaluation of possible sleep apnea.  Medical History: GAD, TMJ.  Medications: pregabalin, Klonopin, omeprazole, melatonin.  PROCEDURE  The unattended portable polysomnogram was conducted on the night of 06/02/2021.  The following parameters were monitored: Nasal and oral airflow, and body position. Additionally, thoracic and abdominal movements were recorded by inductance plethysmography. Oxygen saturation (SpO2) and heart rate (ECG) was monitored using a pulse Oximeter.  The tracing was scored using 30 second epochs. Hypopneas were scored per AASM definition VIIID1.B (4% desaturation).   Description: The total recording time was 263.5 minutes. Sleep parameters are not recorded.  Respiratory monitoring demonstrated significant snoring across the night in all positions. Only 3 respiratory events were noted the entire recording. The mean oxygen saturation was 96 %. The total duration of oxygen < 90% was 0.0 minutes and <80% was 0.0 minutes.   Cardiac monitoring- The average heart rate during the recording was 72.0 bpm.  Impression: This repeat overnight portable polysomnogram did not demonstrate obstructive sleep apnea with only 3 respiratory events noted.   Recommendations:     Negative home studies do not necessarily rule out significant sleep apnea or other sleep disorders. If there is clinical suspicion for sleep apnea or another sleep disorder a fully attended, in laboratory study is recommended. Would recommend weight loss in a patient with a BMI of 31.2 lb/in2.   Allyne Gee, MD [facility] Diplomate ABMS Pulmonary  Critical Care and Sleep Medicine Electronically reviewed and digitally signed   [place]  Portable Polysomnogram Report Part 2 Phone: [phone] Fax: [phone]    Study Date: 06/02/2021  Patient Name: Jamie Kennedy, Jamie Kennedy Recording Device: Glee Arvin  Sex: F Height: 61.0 in.  D.O.B.: [birth year] Weight: 165.0 lbs.  Age: 70 years B.M.I: 31.2 lb/in2   Times and Durations  Lights off clock time:  10:45:01 PM Total Recording Time (TRT): 263.5 minutes  Lights on clock time: 3:08:31 AM Time In Bed (TIB): 263.5 minutes   Summary  AHI 1.0 OAI 1.0 CAI 0.0 Lowest Desat 92  AHI is the number of apneas and hypopneas per hour. OAI is the number of obstructive apneas per hour. CAI is the number of central apneas per hour. Lowest Desat is the lowest blood oxygen level that lasted at least 2 seconds.  RESPIRATORY EVENTS   Index (#/hour) Total # of Events Mean duration  (sec) Max duration  (sec) # of Events by Position       Supine Prone Left Right Up  Central Apneas 0.0 0 0.0 0.0 0    0        Obstructive Apneas 1.0 3 15.3 17.0 1    2        Mixed Apneas 0.0 0 0.0 0.0 0    0        Hypopneas 0.0 0 0.0 0.0 0    0        Apneas + Hypopneas 1.0 3 15.3 17.0 1    2        Total 1.0 3 15.3 17.0 1    2        Time in Position 56.5    120.5  AHI in Position 1.1    1.0          Oximetry Summary   Dur. (min) % TIB  <90 % 0.0 0.0  <85 % 0.0 0.0  <80 % 0.0 0.0  <70 % 0.0 0.0  Total Dur (min) < 89 0.0 min  Average (%) 96  Total # of Desats 7  Desat Index (#/hour) 2.4  Desat Max (%) 6  Desat Max dur (sec) 67.0  Lowest SpO2 % during sleep 92  Duration of Min SpO2 (sec) 16    Heart Rate Stats  Mean HR during sleep (BPM)  Highest HR during sleep 98  (BPM)  Highest HR during TIB  108 (BPM)    Snoring Summary  Total Snoring Episodes 39  Total Duration with Snoring 80.1 minutes  Mean Duration of Snoring 123.2 seconds  Percentage of Snoring 45.2 %

## 2021-06-14 NOTE — Procedures (Signed)
[place]  Portable Polysomnogram Report Part 1 Phone: [phone] Fax: [phone]  Patient Name: Jamie Kennedy, Jamie Kennedy Recording Device: Glee Arvin  D.O.B.: 24-Apr-1986 Acquisition Number: [id number]  Referring Physician: Devona Konig, MD Acquisition Date: 06/01/2021   History: The patient is a 35 years old female who was referred for evaluation of possible sleep apnea.  Medical History: GAD, TMJ.  Medications: pregabalin, Klonopin, omeprazole, melatonin.  PROCEDURE  The unattended portable polysomnogram was conducted on the night of 06/01/2021.  The following parameters were monitored: Nasal and oral airflow, and body position. Additionally, thoracic and abdominal movements were recorded by inductance plethysmography. Oxygen saturation (SpO2) and heart rate (ECG) was monitored using a pulse Oximeter.  The tracing was scored using 30 second epochs. Hypopneas were scored per AASM definition VIIID1.B (4% desaturation).    Description: The total recording time was 490.5 minutes. Sleep parameters are not recorded.  Respiratory monitoring demonstrated significant snoring across the night in all positions. There were a total of 13 apneas and hypopneas for a Respiratory Event Index of 1.7 apneas and hypopneas per hour of recording. The average duration of the respiratory events was 13.3 seconds with a maximum duration of 16.5 seconds. The respiratory events were associated with peripheral oxygen desaturations on the average to 95 %. The lowest oxygen desaturation associated with a respiratory event was 83 %. Additionally, the mean oxygen saturation was 95 %. The total duration of oxygen < 90% was 7.1 minutes and <80% was 0.0 minutes.   Cardiac monitoring- The average heart rate during the recording was 72.9 bpm.  Impression: This routine overnight portable polysomnogram did not demonstrate obstructive sleep apnea. Overall the Respiratory Event Index was 1.7 apneas and  hypopneas per hour of recording.   Recommendations:     A negative home study does not necessarily rule out significant sleep apnea and a repeat study is recommended. Would recommend weight loss in a patient with a BMI of 31.2 lb/in2.  Allyne Gee, MD [facility] Diplomate ABMS Pulmonary Critical Care and Sleep Medicine Electronically reviewed and digitally signed   [place]  Portable Polysomnogram Report Part 2 Phone: [phone] Fax: [phone]    Study Date: 06/01/2021  Patient Name: Jamie Kennedy, Jamie Kennedy Recording Device: Glee Arvin  Sex: F Height: 61.0 in.  D.O.B.: 1985-10-19 Weight: 165.0 lbs.  Age: 35 years B.M.I: 31.2 lb/in2   Times and Durations  Lights off clock time:  10:23:44 PM Total Recording Time (TRT): 490.5 minutes  Lights on clock time: 6:34:14 AM Time In Bed (TIB): 490.5 minutes   Summary  AHI 1.7 OAI 1.4 CAI 0.0 Lowest Desat 83  AHI is the number of apneas and hypopneas per hour. OAI is the number of obstructive apneas per hour. CAI is the number of central apneas per hour. Lowest Desat is the lowest blood oxygen level that lasted at least 2 seconds.  RESPIRATORY EVENTS   Index (#/hour) Total # of Events Mean duration  (sec) Max duration  (sec) # of Events by Position       Supine Prone Left Right Up  Central Apneas 0.0 0 0.0 0.0 0 0 0 0 0  Obstructive Apneas 1.4 11 13.5 16.5 10 0 0 1 0  Mixed Apneas 0.3 2 12.0 13.5 2 0 0 0 0  Hypopneas 0.0 0 0.0 0.0 0 0 0 0 0  Apneas + Hypopneas 1.7 13 13.3 16.5 12 0 0 1 0  Total 1.7 13 13.3 16.5 12 0  0 1 0  Time in Position 234.2 5.1 175.5 49.8 20.7  AHI in Position 3.1 0.0 0.0 1.2 0.0    Oximetry Summary   Dur. (min) % TIB  <90 % 7.1 1.4  <85 % 0.3 0.1  <80 % 0.0 0.0  <70 % 0.0 0.0  Total Dur (min) < 89 6.3 min  Average (%) 95  Total # of Desats 34  Desat Index (#/hour) 4.5  Desat Max (%) 12  Desat Max dur (sec) 63.0  Lowest SpO2 % during sleep 83  Duration of Min SpO2 (sec) 2    Heart Rate  Stats  Mean HR during sleep (BPM)  Highest HR during sleep 105  (BPM)  Highest HR during TIB  110 (BPM)    Snoring Summary  Total Snoring Episodes 181  Total Duration with Snoring 114.5 minutes  Mean Duration of Snoring 37.9 seconds  Percentage of Snoring 24.6 %

## 2021-06-14 NOTE — Progress Notes (Signed)
[facility] [place], [place] [address]  Internal MEDICINE  Office Visit Note  Patient Name: Jamie Kennedy  [id number]  [id number]  Date of Service: 06/15/2021  Chief Complaint  Patient presents with   Follow-up    HPI Pt is here for routine follow up. -Ear pain with bump that started Friday resolved by yesterday. Was worried about recurrence of what triggered with neurologic pain and had called Friday to request antiviral and steroid just in case, but with improvement ended up not starting any medication for it. -Neurologist took her off lyrica, had tried increasing dose and it helped a little with pain, but then had a 10lb wt gain, and had constipation and bloating and some peeling of fingers. Weaned off. Didn't change much without it. A few episodes of tingling into tongue or above eye and more jaw pain, but not the nerve pain. -Sees orofacial pain provider again next week and may try alternative. - HST was done and they already called to let her know that it was negative. Was done more so due to bruxism/clenching and narrow nasal passage. 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 Told her to try flonase and then in Dec has f/u with facial plastic surgeon to determine if any need for nasal surgery. Also did blood work and had a positive ANA, but the other labs were negative. A rheumatology consult was ordered by ENT. She does have a strong Fhx of autoimmune conditions. Has that visit in November. -trying 20mg  omeprazole  instead of 40mg  and seems to be working well after a few weeks. Would like to try script for the 20mg  dose. -Takes klonopin for the bruxism, given by orofacial provider -ENT also ordered a swallow eval, may reach out to myofacial therapist to work on exercises first. May need referral and will let us know if so.  Current Medication: Outpatient Encounter Medications as of 06/14/2021  Medication Sig    clonazePAM (KLONOPIN) 1 MG tablet Take 1/2 tablet to 1 tablet 2 hours before bedtime   famciclovir (FAMVIR) 500 MG tablet Take 1 tablet (500 mg total) by mouth 2 (two) times daily.   fluticasone (FLONASE) 50 MCG/ACT nasal spray Place into the nose.   Melatonin 5 MG CAPS Take 5 mg by mouth at bedtime.   omeprazole (PRILOSEC) 20 MG capsule Take 20 mg by mouth daily.   omeprazole (PRILOSEC) 20 MG capsule Take 1 capsule (20 mg total) by mouth daily.   predniSONE (DELTASONE) 10 MG tablet Take one tab 3 x day for 3 days, then take one tab 2 x a day for 3 days and then take one tab a day for 3 days for copd   VITAMIN D PO Take 1 tablet by mouth daily.   [DISCONTINUED] ascorbic acid (VITAMIN C) 500 MG tablet Take 500 mg by mouth daily. (Patient not taking: Reported on 06/14/2021)   [DISCONTINUED] pregabalin (LYRICA) 50 MG capsule Take 1 capsule (50 mg total) by mouth 3 (three) times daily. (Patient not taking: Reported on 06/14/2021)   No facility-administered encounter medications on file as of 06/14/2021.    Surgical History: History reviewed. No pertinent surgical history.  Medical History: Past Medical History:  Diagnosis Date   GAD (generalized anxiety disorder)    TMJ syndrome    Trigeminal neuralgia of right side of face     Family History: Family History  Problem Relation Age of Onset   Arthritis/Rheumatoid Mother  Anxiety disorder Brother    Parkinson's disease Maternal Grandfather    Dementia Maternal Grandfather     Social History   Socioeconomic History   Marital status: Married    Spouse name: Not on file   Number of children: Not on file   Years of education: Not on file   Highest education level: Not on file  Occupational History   Not on file  Tobacco Use   Smoking status: Former    Types: Cigarettes    Quit date: 04/07/2020    Years since quitting: 1.1   Smokeless tobacco: Never   Tobacco comments:    smoke e-cig daily  Vaping Use   Vaping Use: Never  used  Substance and Sexual Activity   Alcohol use: Never   Drug use: Never   Sexual activity: Yes    Birth control/protection: I.U.D.    Comment: Mirena  Other Topics Concern   Not on file  Social History Narrative   Not on file   Social Determinants of Health   Financial Resource Strain: Not on file  Food Insecurity: Not on file  Transportation Needs: Not on file  Physical Activity: Not on file  Stress: Not on file  Social Connections: Not on file  Intimate Partner Violence: Not on file      Review of Systems  Constitutional:  Negative for chills, fatigue and unexpected weight change.  HENT:  Positive for dental problem. Negative for congestion, postnasal drip, rhinorrhea, sneezing and sore throat.        TMJ and TN on right side  Eyes:  Negative for redness.  Respiratory:  Negative for cough, chest tightness and shortness of breath.   Cardiovascular:  Negative for chest pain and palpitations.  Gastrointestinal:  Negative for abdominal pain, constipation, diarrhea, nausea and vomiting.       Reflux  Genitourinary:  Negative for dysuria and frequency.  Musculoskeletal:  Positive for arthralgias. Negative for back pain, joint swelling and neck pain.  Skin:  Negative for rash.  Neurological: Negative.  Negative for tremors and numbness.       Right side TN improved, no longer sharp/shooting pain  Hematological:  Negative for adenopathy. Does not bruise/bleed easily.  Psychiatric/Behavioral:  Negative for behavioral problems (Depression) and suicidal ideas.    Vital Signs: BP 110/64   Pulse 74   Temp 97.8 F (36.6 C)   Resp 16   Ht 5\' 1"  (1.549 m)   Wt 177 lb (80.3 kg)   SpO2 98%   BMI 33.44 kg/m    Physical Exam Vitals and nursing note reviewed.  Constitutional:      General: She is not in acute distress.    Appearance: She is well-developed. She is obese. She is not diaphoretic.  HENT:     Head: Normocephalic and atraumatic.     Mouth/Throat:     Pharynx:  No oropharyngeal exudate.  Eyes:     Pupils: Pupils are equal, round, and reactive to light.  Neck:     Thyroid: No thyromegaly.     Vascular: No JVD.     Trachea: No tracheal deviation.  Cardiovascular:     Rate and Rhythm: Normal rate and regular rhythm.     Heart sounds: Normal heart sounds. No murmur heard.   No friction rub. No gallop.  Pulmonary:     Effort: Pulmonary effort is normal. No respiratory distress.     Breath sounds: No wheezing or rales.  Chest:     Chest  wall: No tenderness.  Abdominal:     General: Bowel sounds are normal.     Palpations: Abdomen is soft.  Musculoskeletal:        General: Normal range of motion.     Cervical back: Normal range of motion and neck supple.  Lymphadenopathy:     Cervical: No cervical adenopathy.  Skin:    General: Skin is warm and dry.  Neurological:     Mental Status: She is alert and oriented to person, place, and time.     Cranial Nerves: No cranial nerve deficit.  Psychiatric:        Behavior: Behavior normal.        Thought Content: Thought content normal.        Judgment: Judgment normal.       Assessment/Plan: 1. Trigeminal neuralgia syndrome Followed by neurology and orofacial pain specialists  2. Myofascial pain on right side Followed by neurology and orofacial pain specialists  3. Gastroesophageal reflux disease without esophagitis Continue omeprazole - omeprazole (PRILOSEC) 20 MG capsule; Take 1 capsule (20 mg total) by mouth daily.  Dispense: 30 capsule; Refill: 3  4. Flu vaccine need - Flu Vaccine MDCK QUAD PF   General Counseling: Curley verbalizes understanding of the findings of todays visit and agrees with plan of treatment. I have discussed any further diagnostic evaluation that may be needed or ordered today. We also reviewed her medications today. she has been encouraged to call the office with any questions or concerns that should arise related to todays visit.    Orders Placed This  Encounter  Procedures   Flu Vaccine MDCK QUAD PF    Meds ordered this encounter  Medications   omeprazole (PRILOSEC) 20 MG capsule    Sig: Take 1 capsule (20 mg total) by mouth daily.    Dispense:  30 capsule    Refill:  3     This patient was seen by Drema Dallas, PA-C in collaboration with Dr. Clayborn Bigness as a part of collaborative care agreement.   Total time spent:30 Minutes Time spent includes review of chart, medications, test results, and follow up plan with the patient.      Dr Lavera Guise Internal medicine

## 2021-11-03 ENCOUNTER — Other Ambulatory Visit: Payer: Self-pay

## 2021-11-03 ENCOUNTER — Emergency Department (HOSPITAL_COMMUNITY)
Admission: EM | Admit: 2021-11-03 | Discharge: 2021-11-03 | Disposition: A | Discharge status: Home / Self Care | Payer: 59 | Attending: Emergency Medicine | Admitting: Emergency Medicine

## 2021-11-03 ENCOUNTER — Encounter (HOSPITAL_COMMUNITY): Payer: Self-pay

## 2021-11-03 ENCOUNTER — Emergency Department (HOSPITAL_COMMUNITY): Payer: 59

## 2021-11-03 DIAGNOSIS — R200 Anesthesia of skin: Secondary | ICD-10-CM | POA: Diagnosis not present

## 2021-11-03 DIAGNOSIS — R42 Dizziness and giddiness: Secondary | ICD-10-CM | POA: Insufficient documentation

## 2021-11-03 DIAGNOSIS — N9489 Other specified conditions associated with female genital organs and menstrual cycle: Secondary | ICD-10-CM | POA: Insufficient documentation

## 2021-11-03 DIAGNOSIS — M542 Cervicalgia: Secondary | ICD-10-CM | POA: Diagnosis not present

## 2021-11-03 DIAGNOSIS — R29810 Facial weakness: Secondary | ICD-10-CM | POA: Insufficient documentation

## 2021-11-03 LAB — CBC
HCT: 41.4 % (ref 36.0–46.0)
Hemoglobin: 13.5 g/dL (ref 12.0–15.0)
MCH: 29.5 pg (ref 26.0–34.0)
MCHC: 32.6 g/dL (ref 30.0–36.0)
MCV: 90.6 fL (ref 80.0–100.0)
Platelets: 233 10*3/uL (ref 150–400)
RBC: 4.57 MIL/uL (ref 3.87–5.11)
RDW: 12.3 % (ref 11.5–15.5)
WBC: 8.4 10*3/uL (ref 4.0–10.5)
nRBC: 0 % (ref 0.0–0.2)

## 2021-11-03 LAB — CBG MONITORING, ED: Glucose-Capillary: 94 mg/dL (ref 70–99)

## 2021-11-03 LAB — COMPREHENSIVE METABOLIC PANEL
ALT: 14 U/L (ref 0–44)
AST: 14 U/L — ABNORMAL LOW (ref 15–41)
Albumin: 4.2 g/dL (ref 3.5–5.0)
Alkaline Phosphatase: 66 U/L (ref 38–126)
Anion gap: 8 (ref 5–15)
BUN: 7 mg/dL (ref 6–20)
CO2: 25 mmol/L (ref 22–32)
Calcium: 9.1 mg/dL (ref 8.9–10.3)
Chloride: 107 mmol/L (ref 98–111)
Creatinine, Ser: 0.77 mg/dL (ref 0.44–1.00)
GFR, Estimated: >60 mL/min (ref >60)
Glucose, Bld: 105 mg/dL — ABNORMAL HIGH (ref 70–99)
Potassium: 3.4 mmol/L — ABNORMAL LOW (ref 3.5–5.1)
Sodium: 140 mmol/L (ref 135–145)
Total Bilirubin: 0.7 mg/dL (ref 0.3–1.2)
Total Protein: 7 g/dL (ref 6.5–8.1)

## 2021-11-03 LAB — I-STAT BETA HCG BLOOD, ED (MC, WL, AP ONLY): I-stat hCG, quantitative: <5 m[IU]/mL (ref <5)

## 2021-11-03 MED ORDER — IOHEXOL 350 MG/ML SOLN
75.0000 mL | Freq: Once | INTRAVENOUS | Status: AC | PRN
  Administered 2021-11-03: 75 mL via INTRAVENOUS

## 2021-11-03 MED ORDER — MECLIZINE HCL 25 MG PO TABS: 25.0000 mg | ORAL_TABLET | Freq: Three times a day (TID) | ORAL | 0 refills | Status: DC | PRN

## 2021-11-03 NOTE — ED Provider Notes (Signed)
?[facility] ?Provider Note ? ? ?CSN: [id number] ?Arrival date & time: 11/03/21  1422 ? ?  ? ?History ? ?Chief Complaint  ?Patient presents with  ? Dizziness  ? vision changes  ? ? ?Jamie Kennedy is a 36 y.o. 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  She reports the symptoms are somewhat periodic and do not seem to have clear association.  Worsening with certain neck motions including turning to the right.  She endorses she had acute onset tachycardia secondary to right-sided return twice over the last 3 to 4 days.  She was seen by orthopedics earlier today and referred to the emergency department due to right-sided facial droop, and complicated neurologic history.  She is taking some Klonopin which helps control her symptoms, she is trying to wean off she is taking 1/4 tablet at this time. She denies any significant hearing difficulties, right sided ear pain. ? ? ?Dizziness ? ?  ? ?Home Medications ?Prior to Admission medications   ?Medication Sig Start Date End Date Taking? Authorizing Provider  ?meclizine (ANTIVERT) 25 MG tablet Take 1 tablet (25 mg total) by mouth 3 (three) times daily as needed for dizziness. 11/03/21  Yes Tenika Keeran H, PA-C  ?clonazePAM (KLONOPIN) 1 MG tablet Take 1/2 tablet to 1 tablet 2 hours before bedtime 01/19/21   [provider]  ?famciclovir (FAMVIR) 500 MG tablet Take 1 tablet (500 mg total) by mouth 2 (two) times daily. 06/11/21    McDonough, Si Gaul, PA-C  ?fluticasone (FLONASE) 50 MCG/ACT nasal spray Place into the nose. 05/28/21 05/28/22  [provider]  ?Melatonin 5 MG CAPS Take 5 mg by mouth at bedtime.    [provider]  ?omeprazole (PRILOSEC) 20 MG capsule Take 20 mg by mouth daily.    [provider]  ?omeprazole (PRILOSEC) 20 MG capsule Take 1 capsule (20 mg total) by mouth daily. 06/14/21   McDonough, Si Gaul, PA-C  ?predniSONE (DELTASONE) 10 MG tablet Take one tab 3 x day for 3 days, then take one tab 2 x a day for 3 days and then take one tab a day for 3 days for copd 06/11/21   McDonough, Lauren K, PA-C  ?VITAMIN D PO Take 1 tablet by mouth daily.    [provider]  ?   ? ?Allergies    ?Sulfa antibiotics   ? ?Review of Systems   ?Review of Systems  ?Neurological:  Positive for dizziness, facial asymmetry and numbness.  ?All other systems reviewed and are negative. ? ?Physical Exam ?Updated Vital Signs ?BP 108/64 (BP Location: Right Arm)   Pulse 77   Temp 98.3 ?F (36.8 ?C) (Oral)   Resp (!) 22   Ht '5\' 1"'$  (1.549 m)   Wt 76.7 kg   SpO2 100%   BMI 31.93 kg/m?  ?Physical Exam ?Vitals and nursing note reviewed.  ?Constitutional:   ?   General: She is not in acute distress. ?   Appearance: Normal appearance.  ?HENT:  ?   Head: Normocephalic and atraumatic.  ?  Right Ear: Tympanic membrane normal. There is no impacted cerumen.  ?   Left Ear: Tympanic membrane normal. There is no impacted cerumen.  ?Eyes:  ?   General:     ?   Right eye: No discharge.     ?   Left eye: No discharge.  ?Cardiovascular:  ?   Rate and Rhythm: Normal rate and regular rhythm.  ?   Heart sounds: No murmur heard. ?  No friction rub. No gallop.  ?Pulmonary:  ?   Effort: Pulmonary effort is normal.  ?   Breath sounds: Normal breath sounds.  ?Abdominal:  ?   General: Bowel sounds are normal.  ?   Palpations: Abdomen is soft.  ?Musculoskeletal:  ?   Comments: TTP right sided TMJ, cervical paraspinous muscles. Intact ROM  of C-spine, although patient endorses some dizziness with right sided gaze.  ?Skin: ?   General: Skin is warm and dry.  ?   Capillary Refill: Capillary refill takes less than 2 seconds.  ?Neurological:  ?   Mental Status: She is alert and oriented to person, place, and time.  ?   Comments: Cranial nerves II through XII with deficits as follows: right sided facial droop sparing forehead. Patient with sensation of right sided obstruction with swallow but swallow intact, uvula is midline. No significant difference in face sensation at this time but patient reports on and off numbness. All other cranial nerves grossly intact. Intact finger-nose, intact heel-to-shin.  Romberg negative, gait normal, but patient with subjective dizziness on standing. No step out.  Alert and oriented x3.  Moves all 4 limbs spontaneously, normal coordination.  No pronator drift.  Intact strength 5 out of 5 bilateral upper and lower extremities. ? ?  ?Psychiatric:     ?   Mood and Affect: Mood normal.     ?   Behavior: Behavior normal.  ? ? ?ED Results / Procedures / Treatments   ?Labs ?(all labs ordered are listed, but only abnormal results are displayed) ?Labs Reviewed  ?COMPREHENSIVE METABOLIC PANEL - Abnormal; Notable for the following components:  ?    Result Value  ? Potassium 3.4 (*)   ? Glucose, Bld 105 (*)   ? AST 14 (*)   ? All other components within normal limits  ?CBC  ?CBG MONITORING, ED  ?I-STAT BETA HCG BLOOD, ED ([facility], [facility], [facility] ONLY)  ? ? ?EKG ?None ? ?Radiology ?CT ANGIO HEAD NECK W WO CM ? ?Result Date: 11/03/2021 ?CLINICAL DATA:  Dizziness and visual change 1 week. Rule out cerebral vasospasm. Right facial droop EXAM: CT ANGIOGRAPHY HEAD AND NECK TECHNIQUE: Multidetector CT imaging of the head and neck was performed using the standard protocol during bolus administration of intravenous contrast. Multiplanar CT image reconstructions and MIPs were obtained to evaluate the vascular anatomy. Carotid stenosis measurements (when  applicable) are obtained utilizing NASCET criteria, using the distal internal carotid diameter as the denominator. RADIATION DOSE REDUCTION: This exam was performed according to the departmental dose-optimization program which includes automated exposure control, adjustment of the mA and/or kV according to patient size and/or use of iterative reconstruction technique. CONTRAST:  46m OMNIPAQUE IOHEXOL 350 MG/ML SOLN COMPARISON:  MRI head 05/13/2020 FINDINGS: CT HEAD FINDINGS Brain: No evidence of acute infarction, hemorrhage, hydrocephalus, extra-axial collection or mass lesion/mass effect. Vascular: Negative for hyperdense vessel Skull: Negative Sinuses: Paranasal sinuses clear.  Small left mastoid effusion. Orbits: Negative Review of the MIP images confirms the above findings CTA NECK FINDINGS Aortic arch:  Standard branching. Imaged portion shows no evidence of aneurysm or dissection. No significant stenosis of the major arch vessel origins. Right carotid system: Normal right carotid system. Negative for atherosclerotic disease, dissection, or stenosis Left carotid system: Normal left carotid system. Negative for atherosclerotic disease, dissection, or stenosis Vertebral arteries: Small vertebral arteries bilaterally without stenosis. Skeleton: Negative Other neck: Negative Upper chest: Lung apices clear bilaterally. Review of the MIP images confirms the above findings CTA HEAD FINDINGS Anterior circulation: Internal carotid artery widely patent through the skull base and cavernous segment. No stenosis or aneurysm. Anterior and middle cerebral arteries widely patent bilaterally. Posterior circulation: Small vertebral arteries are patent to the basilar. Basilar is small and patent. Hypoplastic P1 segments due to fetal origin of the posterior cerebral artery. Both posterior cerebral arteries are widely patent. Venous sinuses: Normal venous enhancement Anatomic variants: None Review of the MIP images confirms the  above findings IMPRESSION: 1. Negative CT head 2. Negative CT angio head and neck 3. Small vertebrobasilar system due to fetal origin of the posterior cerebral arteries bilaterally. No evidence of cerebral basal

## 2021-11-03 NOTE — ED Triage Notes (Addendum)
Pt arrived POV c/o dizziness and visual changes x1 week. This has happened before but has been constant now. Pt has a right facial droop as well.  ?

## 2021-11-03 NOTE — ED Provider Triage Note (Addendum)
Emergency Medicine Provider Triage Evaluation Note ? ?Jamie Kennedy , a 36 y.o. female  was evaluated in triage.  Pt complains of dizziness, vision changes, facial droop intermittently for about a year.  Patient states that she has had an MRI before, and they had greatest concern for trigeminal neuralgia.  She has persistent right-sided facial pain and right neck pain, made worse when she turns her head to the right with associated difficulty swallowing.  She went to [facility] today and hoping they could "work on her neck" D some of her symptoms, but the provider was concerned about vertebral basilar artery insufficiency.  She states that her dizziness got worse over the weekend, with associated vision changes. ? ?Review of Systems  ?Positive: Dizziness, vision changes, right facial pain, right neck pain, difficulty swallowing ?Negative: Weakness, numbness, syncope ? ?Physical Exam  ?BP 125/85   Pulse 88   Temp 98.2 ?F (36.8 ?C) (Oral)   Resp 16   Ht '5\' 1"'$  (1.549 m)   Wt 76.7 kg   SpO2 100%   BMI 31.93 kg/m?  ?Gen:   Awake, no distress   ?Resp:  Normal effort  ?MSK:   Moves extremities without difficulty  ?Other:  Right sided facial droop noted (patient reports this is intermittent) ? ?Medical Decision Making  ?Medically screening exam initiated at 2:41 PM.  Appropriate orders placed.  Jamie Kennedy was informed that the remainder of the evaluation will be completed by another provider, this initial triage assessment does not replace that evaluation, and the importance of remaining in the ED until their evaluation is complete. ? ? ?  ?Kateri Plummer, PA-C ?11/03/21 1446 ? ?3:40pm ?Discussed with attending physician Dr. Pearline Cables about patient's symptoms and history, we agreed to go ahead and order patient CTA head and neck. ?  Estill Cotta ?11/03/21 1543 ? ?

## 2021-11-03 NOTE — Discharge Instructions (Addendum)
I encourage you to follow-up with neurology, ENT.  Please return to the emergency department if you have concern for worsening symptoms, worsening weakness of your arms, legs, syncope or collapse. ?

## 2021-11-17 ENCOUNTER — Ambulatory Visit: Payer: 59 | Admitting: Nurse Practitioner

## 2021-11-17 ENCOUNTER — Encounter: Payer: Self-pay | Admitting: Nurse Practitioner

## 2021-11-17 ENCOUNTER — Ambulatory Visit
Admission: RE | Admit: 2021-11-17 | Discharge: 2021-11-17 | Disposition: A | Discharge status: Home / Self Care | Payer: 59 | Source: Ambulatory Visit | Attending: Nurse Practitioner | Admitting: Nurse Practitioner

## 2021-11-17 VITALS — BP 140/88 | HR 105 | Temp 99.0°F | Resp 16 | Ht 61.0 in | Wt 181.0 lb

## 2021-11-17 DIAGNOSIS — R590 Localized enlarged lymph nodes: Secondary | ICD-10-CM

## 2021-11-17 DIAGNOSIS — M542 Cervicalgia: Secondary | ICD-10-CM

## 2021-11-17 DIAGNOSIS — J0140 Acute pansinusitis, unspecified: Secondary | ICD-10-CM

## 2021-11-17 DIAGNOSIS — R221 Localized swelling, mass and lump, neck: Secondary | ICD-10-CM | POA: Diagnosis not present

## 2021-11-17 MED ORDER — AMOXICILLIN-POT CLAVULANATE 875-125 MG PO TABS: 1.0000 | ORAL_TABLET | Freq: Two times a day (BID) | ORAL | 0 refills | Status: AC

## 2021-11-17 NOTE — Progress Notes (Signed)
[facility] ?[address] ?[place], [place] [address] ? ?Internal MEDICINE  ?Office Visit Note ? ?Patient Name: Jamie Kennedy ? [id number]  ?[id number] ? ?Date of Service: 11/17/2021 ? ?Chief Complaint  ?Patient presents with  ? Acute Visit  ?  Started last Wednesday, Neck swelling, pt kept ice pack on it all morning and swelling has gone down   ? Sore Throat  ? Cough  ? Fever  ?  On Sunday   ? Neck Pain  ? Headache  ?  Head pressure  ? ? ? ?HPI ?Jamie Kennedy presents for an acute sick visit for symptoms of sinusitis. She has been sick since last Wednesday. She reports having fever body aches, sore throat, difficulty swallowing, red sore throat, white patches of exudate, Sore swollen tender at front of the neck.  ?She was given prescription for cefdinir by urgent care. She was negative for strep, flu, mono, covid and RSV.  ?Cannot take oral decongestant medication due to benign tachycardia.  ?No other new medicines or foods ?Her Son and husband also sick,  ? ? ?Current Medication: ? ?Outpatient Encounter Medications as of 11/17/2021  ?Medication Sig  ? amoxicillin-clavulanate (AUGMENTIN) 875-125 MG tablet Take 1 tablet by mouth 2 (two) times daily for 10 days.  ? clonazePAM (KLONOPIN) 1 MG tablet Take 1/2 tablet to 1 tablet 2 hours before bedtime  ? famciclovir (FAMVIR) 500 MG tablet Take 1 tablet (500 mg total) by mouth 2 (two) times daily.  ? fluticasone (FLONASE) 50 MCG/ACT nasal spray Place into the nose.  ? meclizine (ANTIVERT) 25 MG tablet Take 1 tablet (25 mg total) by mouth 3 (three) times daily as needed for dizziness.  ? Melatonin 5 MG CAPS Take 5 mg by mouth at bedtime.  ? omeprazole (PRILOSEC) 20 MG capsule Take 20 mg by mouth daily.  ? omeprazole (PRILOSEC) 20 MG capsule Take 1 capsule (20 mg total) by mouth daily.  ? predniSONE (DELTASONE) 10 MG tablet Take one tab 3 x day for 3 days, then take one tab 2 x a day for 3 days and then take one tab a day for 3 days for copd  ? VITAMIN D PO Take 1 tablet by  mouth daily.  ? ?No facility-administered encounter medications on file as of 11/17/2021.  ? ? ? ? ?Medical History: ?Past Medical History:  ?Diagnosis Date  ? GAD (generalized anxiety disorder)   ? TMJ syndrome   ? Trigeminal neuralgia of right side of face   ? ? ? ?Vital Signs: ?BP 140/88   Pulse (!) 105   Temp 99 ?F (37.2 ?C)   Resp 16   Ht '5\' 1"'$  (1.549 m)   Wt 181 lb (82.1 kg)   SpO2 98%   BMI 34.20 kg/m?  ? ? ?Review of Systems  ?Constitutional:  Positive for appetite change, chills, fatigue and fever.  ?HENT:  Positive for congestion, postnasal drip, rhinorrhea, sinus pressure, sinus pain, sore throat and trouble swallowing.   ?Respiratory:  Positive for cough. Negative for chest tightness, shortness of breath and wheezing.   ?Cardiovascular: Negative.  Negative for chest pain and palpitations.  ?Gastrointestinal:  Negative for abdominal pain, constipation, diarrhea, nausea and vomiting.  ?Musculoskeletal:  Positive for myalgias.  ?Neurological:  Positive for headaches.  ? ?Physical Exam ?Vitals reviewed.  ?Constitutional:   ?   General: She is not in acute distress. ?   Appearance: She is well-developed. She is obese. She is ill-appearing.  ?HENT:  ?  Head: Normocephalic and atraumatic.  ?   Right Ear: Tympanic membrane and ear canal normal.  ?   Left Ear: Tympanic membrane and ear canal normal.  ?   Nose: Congestion and rhinorrhea present.  ?   Mouth/Throat:  ?   Pharynx: Oropharyngeal exudate and posterior oropharyngeal erythema present.  ?   Tonsils: Tonsillar exudate present. 2+ on the right. 2+ on the left.  ?Eyes:  ?   Conjunctiva/sclera: Conjunctivae normal.  ?   Pupils: Pupils are equal, round, and reactive to light.  ?Neck:  ?   Thyroid: No thyroid mass, thyromegaly or thyroid tenderness.  ?   Trachea: Trachea and phonation normal. No tracheal tenderness.  ?Cardiovascular:  ?   Rate and Rhythm: Regular rhythm. Tachycardia present.  ?   Heart sounds: Normal heart sounds. No murmur  heard. ?Pulmonary:  ?   Effort: Pulmonary effort is normal. No respiratory distress.  ?   Breath sounds: Normal breath sounds. No wheezing.  ?Musculoskeletal:  ?   Cervical back: Edema present. Muscular tenderness present.  ?Lymphadenopathy:  ?   Cervical: Cervical adenopathy present.  ?   Right cervical: Superficial cervical adenopathy, deep cervical adenopathy and posterior cervical adenopathy present.  ?   Left cervical: Superficial cervical adenopathy, deep cervical adenopathy and posterior cervical adenopathy present.  ?   Upper Body:  ?   Right upper body: Supraclavicular adenopathy present.  ?   Left upper body: Supraclavicular adenopathy present.  ?Skin: ?   General: Skin is warm and dry.  ?   Capillary Refill: Capillary refill takes less than 2 seconds.  ?Neurological:  ?   Mental Status: She is alert and oriented to person, place, and time.  ?Psychiatric:     ?   Mood and Affect: Mood normal.     ?   Behavior: Behavior normal.  ? ? ? ? ?Assessment/Plan: ?1. Acute non-recurrent pansinusitis ?Empiric antibiotic prescribed. Cefdinir discontinued.  ?- amoxicillin-clavulanate (AUGMENTIN) 875-125 MG tablet; Take 1 tablet by mouth 2 (two) times daily for 10 days.  Dispense: 20 tablet; Refill: 0 ? ?2. Swollen neck ?Stat ultrasound ordered due to pronounced superficial soreness and swelling of the neck which may be due to acute enlarged lymph nodes due to infection but also need to rule out abnormalities associated with a mass or parotid or parathyroid or thyroid glands.  ?- US Soft Tissue Head/Neck (NON-THYROID); Future ? ?3. Tenderness of neck ?See problem #2 ?- US Soft Tissue Head/Neck (NON-THYROID); Future ? ?4. Supraclavicular lymphadenopathy ?May be related to acute sinusitis. If continues after sinus infection clears, may want to evaluate further.  ? ? ?General Counseling: Jamie Kennedy verbalizes understanding of the findings of todays visit and agrees with plan of treatment. I have discussed any further diagnostic  evaluation that may be needed or ordered today. We also reviewed her medications today. she has been encouraged to call the office with any questions or concerns that should arise related to todays visit. ? ? ? ?Counseling: ? ? ? ?Orders Placed This Encounter  ?Procedures  ? US Soft Tissue Head/Neck (NON-THYROID)  ? ? ?Meds ordered this encounter  ?Medications  ? amoxicillin-clavulanate (AUGMENTIN) 875-125 MG tablet  ?  Sig: Take 1 tablet by mouth 2 (two) times daily for 10 days.  ?  Dispense:  20 tablet  ?  Refill:  0  ? ? ?Return in about 1 week (around 11/24/2021) for F/U ultrasound results, Walsie Smeltz PCP. ? ?[place] Controlled Substance Database was reviewed by me for  overdose risk score (ORS) ? ?Time spent:30 Minutes ?Time spent with patient included reviewing progress notes, labs, imaging studies, and discussing plan for follow up.  ? ?This patient was seen by Jonetta Osgood, FNP-C in collaboration with Dr. Clayborn Bigness as a part of collaborative care agreement. ? ?Jelesa Mangini R. Valetta Fuller, MSN, FNP-C ?Internal Medicine ?

## 2021-11-18 ENCOUNTER — Encounter: Payer: Self-pay | Admitting: Nurse Practitioner

## 2021-11-22 ENCOUNTER — Telehealth: Payer: Self-pay

## 2021-11-22 NOTE — Telephone Encounter (Signed)
Patient scheduled for stat US soft tissue at [facility] .tat ?

## 2021-11-25 ENCOUNTER — Ambulatory Visit: Payer: 59 | Admitting: Nurse Practitioner

## 2021-11-25 ENCOUNTER — Encounter: Payer: Self-pay | Admitting: Nurse Practitioner

## 2021-11-25 VITALS — BP 104/76 | HR 96 | Temp 98.3°F | Resp 16 | Ht 61.0 in | Wt 181.4 lb

## 2021-11-25 DIAGNOSIS — J0140 Acute pansinusitis, unspecified: Secondary | ICD-10-CM | POA: Diagnosis not present

## 2021-11-25 DIAGNOSIS — M542 Cervicalgia: Secondary | ICD-10-CM

## 2021-11-25 DIAGNOSIS — E782 Mixed hyperlipidemia: Secondary | ICD-10-CM

## 2021-11-25 DIAGNOSIS — R221 Localized swelling, mass and lump, neck: Secondary | ICD-10-CM

## 2021-11-25 DIAGNOSIS — E559 Vitamin D deficiency, unspecified: Secondary | ICD-10-CM

## 2021-11-25 DIAGNOSIS — K219 Gastro-esophageal reflux disease without esophagitis: Secondary | ICD-10-CM

## 2021-11-25 NOTE — Progress Notes (Signed)
[facility] ?[address] ?[place], [place] [address] ? ?Internal MEDICINE  ?Office Visit Note ? ?Patient Name: Jamie Kennedy ? [id number]  ?[id number] ? ?Date of Service: 11/25/2021 ? ?Chief Complaint  ?Patient presents with  ? Results  ?  Review U/S results  ? Sinusitis  ?  Still having issues been on antibiotics for 14 days has 1 1/2 days left.  Pt still having a lot of congestion behind nose and throat and doesn't want to come out.  Been doing nasal sprays and lavages, allegra.     ? ? ?HPI ?Sandeep presents for a follow up visit to discuss the ultrasound of her neck and the persistent symptoms of sinusitis. She was provided with a 2 week course of augmentin which she will finsih in the next 2 days but her symptoms are still present, her neck is still swollen and sore. She has green nasal drainage, sinus pain and pressure. She has also been using nasal sprays, nasal rinses and lavages and OTC allegra.  ?She has an upcoming annual physical and wants to have her routine labs drawn prior to her appointment.  ? ? ?Current Medication: ?Outpatient Encounter Medications as of 11/25/2021  ?Medication Sig  ? amoxicillin-clavulanate (AUGMENTIN) 875-125 MG tablet Take 1 tablet by mouth 2 (two) times daily for 10 days.  ? azelastine (ASTELIN) 0.1 % nasal spray Place into the nose.  ? clonazePAM (KLONOPIN) 1 MG tablet Take 1/2 tablet to 1 tablet 2 hours before bedtime  ? fluticasone (FLONASE) 50 MCG/ACT nasal spray Place into the nose.  ? meclizine (ANTIVERT) 25 MG tablet Take 1 tablet (25 mg total) by mouth 3 (three) times daily as needed for dizziness.  ? Melatonin 5 MG CAPS Take 5 mg by mouth at bedtime.  ? omeprazole (PRILOSEC) 20 MG capsule Take 1 capsule (20 mg total) by mouth daily.  ? VITAMIN D PO Take 1 tablet by mouth daily.  ? famciclovir (FAMVIR) 500 MG tablet Take 1 tablet (500 mg total) by mouth 2 (two) times daily. (Patient not taking: Reported on 11/25/2021)  ? [DISCONTINUED] omeprazole (PRILOSEC) 20 MG  capsule Take 20 mg by mouth daily. (Patient not taking: Reported on 11/25/2021)  ? [DISCONTINUED] predniSONE (DELTASONE) 10 MG tablet Take one tab 3 x day for 3 days, then take one tab 2 x a day for 3 days and then take one tab a day for 3 days for copd (Patient not taking: Reported on 11/25/2021)  ? ?No facility-administered encounter medications on file as of 11/25/2021.  ? ? ?Surgical History: ?History reviewed. No pertinent surgical history. ? ?Medical History: ?Past Medical History:  ?Diagnosis Date  ? GAD (generalized anxiety disorder)   ? TMJ syndrome   ? Trigeminal neuralgia of right side of face   ? ? ?Family History: ?Family History  ?Problem Relation Age of Onset  ? Arthritis/Rheumatoid Mother   ? Ankylosing spondylitis Sister   ? Endometriosis Sister   ? Anxiety disorder Brother   ? Irritable bowel syndrome Brother   ? Kidney Stones Brother   ? Irritable bowel syndrome Brother   ? Parkinson's disease Maternal Grandfather   ? Dementia Maternal Grandfather   ? ? ?Social History  ? ?Socioeconomic History  ? Marital status: Married  ?  Spouse name: Not on file  ? Number of children: Not on file  ? Years of education: Not on file  ? Highest education level: Not on file  ?Occupational History  ? Not on file  ?  Tobacco Use  ? Smoking status: Former  ?  Types: Cigarettes  ?  Quit date: 04/07/2020  ?  Years since quitting: 1.6  ? Smokeless tobacco: Never  ? Tobacco comments:  ?  smoke e-cig daily  ?Vaping Use  ? Vaping Use: Never used  ?Substance and Sexual Activity  ? Alcohol use: Never  ? Drug use: Never  ? Sexual activity: Yes  ?  Birth control/protection: I.U.D.  ?  Comment: Mirena  ?Other Topics Concern  ? Not on file  ?Social History Narrative  ? Not on file  ? ?Social Determinants of Health  ? ?Financial Resource Strain: Not on file  ?Food Insecurity: Not on file  ?Transportation Needs: Not on file  ?Physical Activity: Not on file  ?Stress: Not on file  ?Social Connections: Not on file  ?Intimate Partner  Violence: Not on file  ? ? ? ? ?Review of Systems  ?Constitutional:  Positive for appetite change and fever. Negative for chills, fatigue and unexpected weight change.  ?HENT:  Positive for postnasal drip, sinus pressure, sinus pain and trouble swallowing. Negative for congestion, rhinorrhea, sneezing and sore throat.   ?Eyes:  Negative for redness.  ?Respiratory:  Negative for cough, chest tightness, shortness of breath and wheezing.   ?Cardiovascular: Negative.  Negative for chest pain and palpitations.  ?Gastrointestinal:  Negative for abdominal pain, constipation, diarrhea, nausea and vomiting.  ?Genitourinary:  Negative for dysuria and frequency.  ?Musculoskeletal:  Positive for myalgias. Negative for arthralgias, back pain, joint swelling and neck pain.  ?Skin:  Negative for rash.  ?Neurological: Negative.  Negative for tremors and numbness.  ?Hematological:  Negative for adenopathy. Does not bruise/bleed easily.  ?Psychiatric/Behavioral:  Negative for behavioral problems (Depression), sleep disturbance and suicidal ideas. The patient is not nervous/anxious.   ? ?Vital Signs: ?BP 104/76   Pulse 96   Temp 98.3 ?F (36.8 ?C)   Resp 16   Ht '5\' 1"'  (1.549 m)   Wt 181 lb 6.4 oz (82.3 kg)   SpO2 96%   BMI 34.28 kg/m?  ? ? ?Physical Exam ?Vitals reviewed.  ?Constitutional:   ?   General: She is not in acute distress. ?   Appearance: She is well-developed. She is obese. She is ill-appearing.  ?HENT:  ?   Head: Normocephalic and atraumatic.  ?   Right Ear: Tympanic membrane and ear canal normal.  ?   Left Ear: Tympanic membrane and ear canal normal.  ?   Nose: Congestion and rhinorrhea present.  ?   Mouth/Throat:  ?   Pharynx: Oropharyngeal exudate and posterior oropharyngeal erythema present.  ?   Tonsils: Tonsillar exudate present. 2+ on the right. 2+ on the left.  ?Eyes:  ?   Conjunctiva/sclera: Conjunctivae normal.  ?   Pupils: Pupils are equal, round, and reactive to light.  ?Neck:  ?   Thyroid: No thyroid  mass, thyromegaly or thyroid tenderness.  ?   Trachea: Trachea and phonation normal. No tracheal tenderness.  ?Cardiovascular:  ?   Rate and Rhythm: Normal rate and regular rhythm.  ?   Heart sounds: Normal heart sounds. No murmur heard. ?Pulmonary:  ?   Effort: Pulmonary effort is normal. No respiratory distress.  ?   Breath sounds: Normal breath sounds. No wheezing.  ?Musculoskeletal:  ?   Cervical back: Edema present. Muscular tenderness present.  ?Lymphadenopathy:  ?   Cervical: Cervical adenopathy present.  ?   Right cervical: Superficial cervical adenopathy, deep cervical adenopathy and posterior cervical  adenopathy present.  ?   Left cervical: Superficial cervical adenopathy, deep cervical adenopathy and posterior cervical adenopathy present.  ?Skin: ?   General: Skin is warm and dry.  ?   Capillary Refill: Capillary refill takes less than 2 seconds.  ?Neurological:  ?   Mental Status: She is alert and oriented to person, place, and time.  ?Psychiatric:     ?   Mood and Affect: Mood normal.     ?   Behavior: Behavior normal.  ? ? ? ? ? ?Assessment/Plan: ?1. Acute non-recurrent pansinusitis ?Recommended seeing her ENT, already has an upcoming appt with her ENT provider.  ?- CBC with Differential/Platelet ?- CMP14+EGFR ? ?2. Swollen neck ?Seep problems #1 and 3. ?- CBC with Differential/Platelet ?- CMP14+EGFR ?- TSH + free T4 ? ?3. Tenderness of neck ?Labs ordered. Patient has upcoming office visit with ENT specialist.  ?- CBC with Differential/Platelet ?- CMP14+EGFR ?- TSH + free T4 ? ?4. Mixed hyperlipidemia ?Routine labs ordered.  ?- CBC with Differential/Platelet ?- CMP14+EGFR ?- Lipid Profile ? ?5. Vitamin D deficiency ?Routine labs ordered.  ?- CBC with Differential/Platelet ?- CMP14+EGFR ?- Vitamin D (25 hydroxy) ? ?6. Gastroesophageal reflux disease without esophagitis ?Routine labs ordered. Has been worse lately.  ?- CBC with Differential/Platelet ?- CMP14+EGFR ? ? ?General Counseling: Eulah verbalizes  understanding of the findings of todays visit and agrees with plan of treatment. I have discussed any further diagnostic evaluation that may be needed or ordered today. We also reviewed her medications today. she

## 2021-12-13 ENCOUNTER — Ambulatory Visit: Payer: 59 | Admitting: Physician Assistant

## 2022-02-04 IMAGING — MR MR HEAD WO/W CM
10 of 14 series · 28 of 48 positions shown · IV contrast (gadavist)
Comparison: None.

CLINICAL DATA: Right-sided facial droop. Right ear pain and right
mouth numbness.

EXAM:
MRI HEAD WITHOUT AND WITH CONTRAST
TECHNIQUE: Multiplanar, multiecho pulse sequences of the brain and surrounding
structures were obtained without and with intravenous contrast.
CONTRAST:  7.5mL GADAVIST GADOBUTROL 1 MMOL/ML IV SOLN

[Series 5: ax dwi_tracew · axial · 3.0mm · 0.60mm/px · z∈[-133,+7]mm · 3 of 44 slices shown]
[im 1/44]
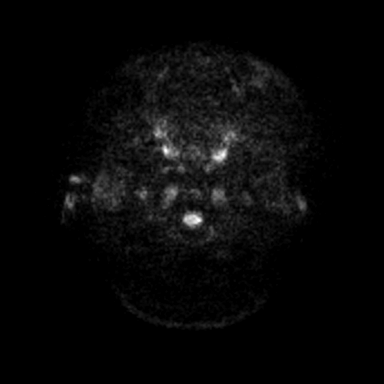
[im 22/44]
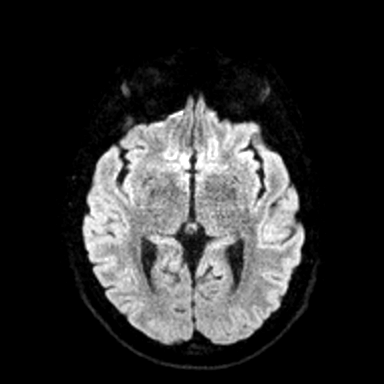
[im 44/44]
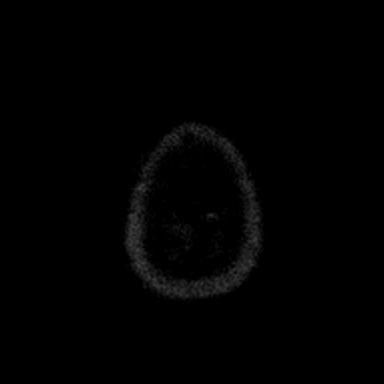

[Series 6: ax dwi_adc · axial · 3.0mm · 0.60mm/px · z∈[-133,-65]mm · 2 of 44 slices shown]
[im 1/44]
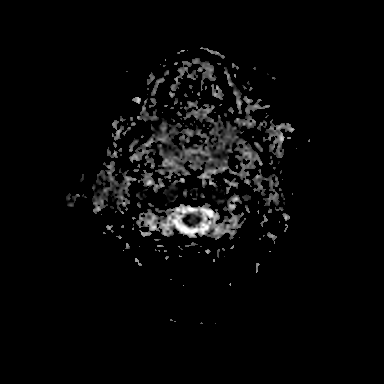
[im 22/44]
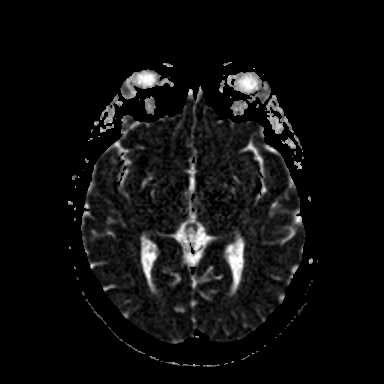

[Series 7: T2 · axial · 5.0mm · 0.53mm/px · z∈[-123,+20]mm · 2 of 25 slices shown]
[im 1/25]
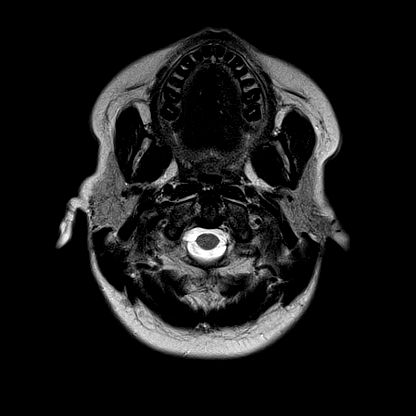
[im 25/25]
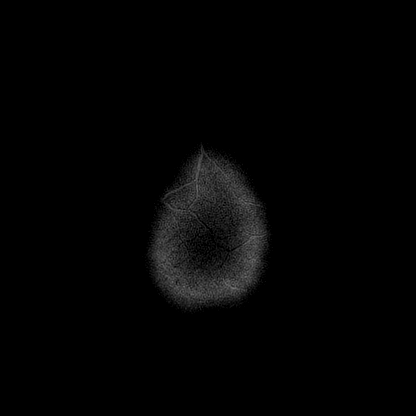

[Series 8: T1 · sagittal · 5.0mm · 0.62mm/px · 2 of 21 slices shown (1 of 3)]
[im 1/21]
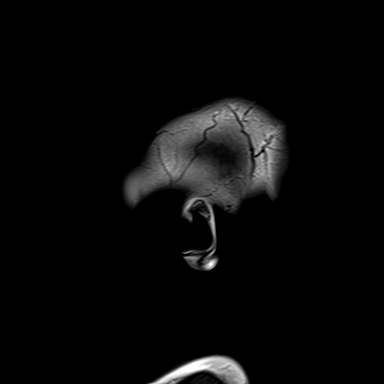
[im 21/21]
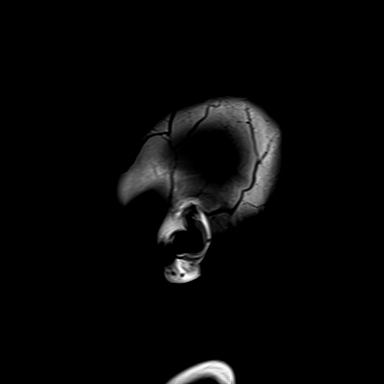

[Series 13: T1 · coronal · non-contrast · 3.0mm · 0.21mm/px · 1 of 11 slices shown (2 of 3)]
[im 1/11]
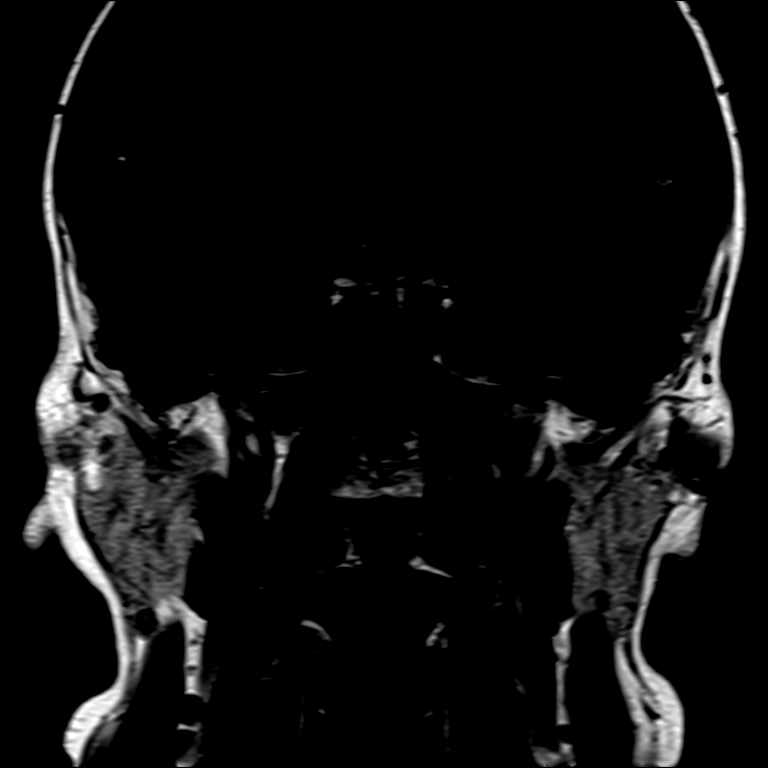

[Series 14: FLAIR · axial · 3.0mm · 0.53mm/px · z∈[-132,+29]mm · 4 of 55 slices shown]
[im 1/55]
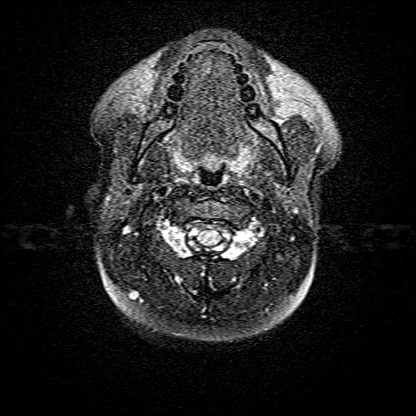
[im 19/55]
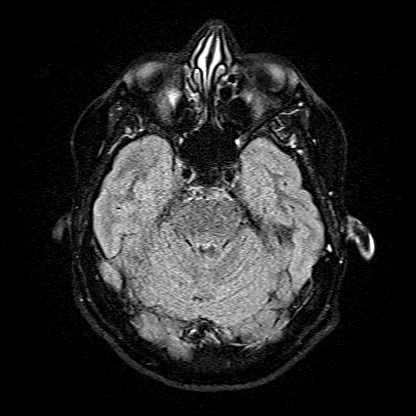
[im 37/55]
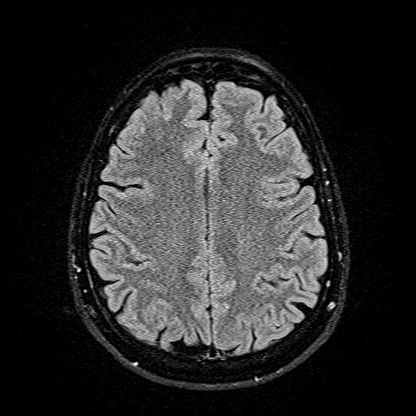
[im 55/55]
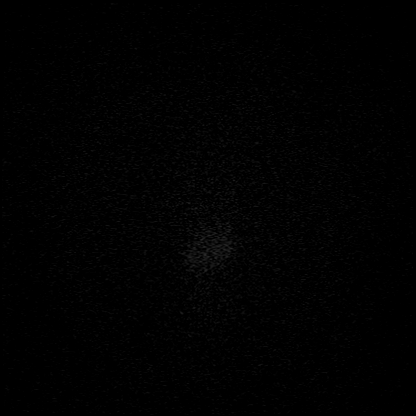

[Series 16: T1 · axial · non-contrast · 3.0mm · 0.21mm/px · 1 of 11 slices shown (3 of 3)]
[im 1/11]
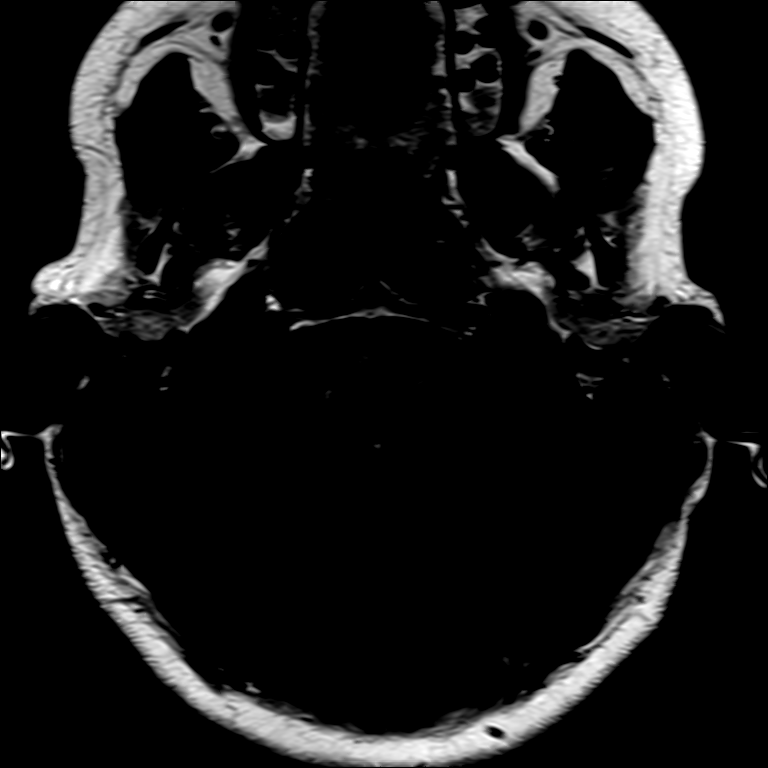

[Series 17: T1 post-contrast · axial · 3.0mm · 0.21mm/px · 1 of 11 slices shown (1 of 3)]
[im 1/11]
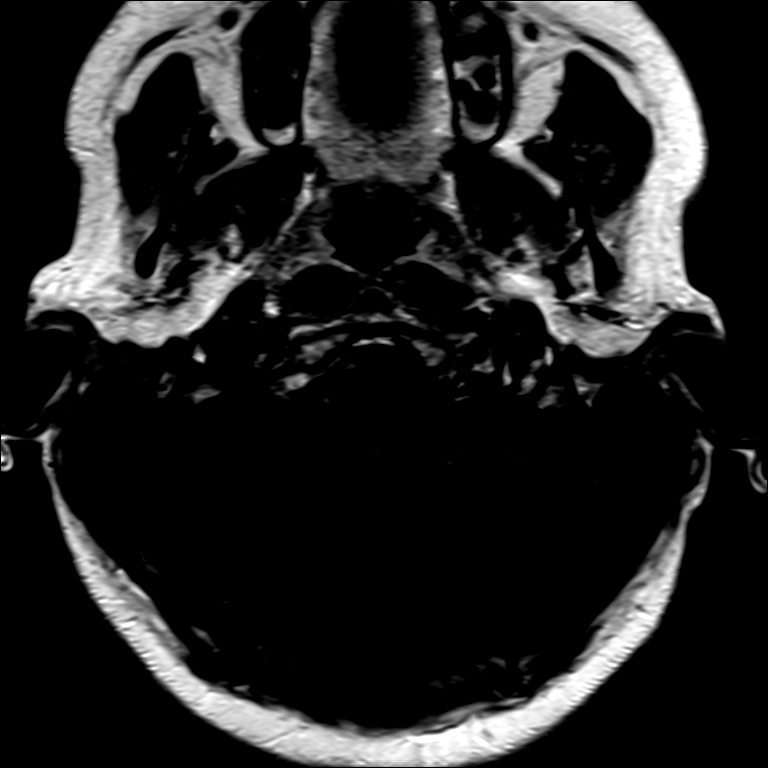

[Series 18: T1 post-contrast · coronal · 3.0mm · 0.21mm/px · 1 of 11 slices shown (2 of 3)]
[im 1/11]
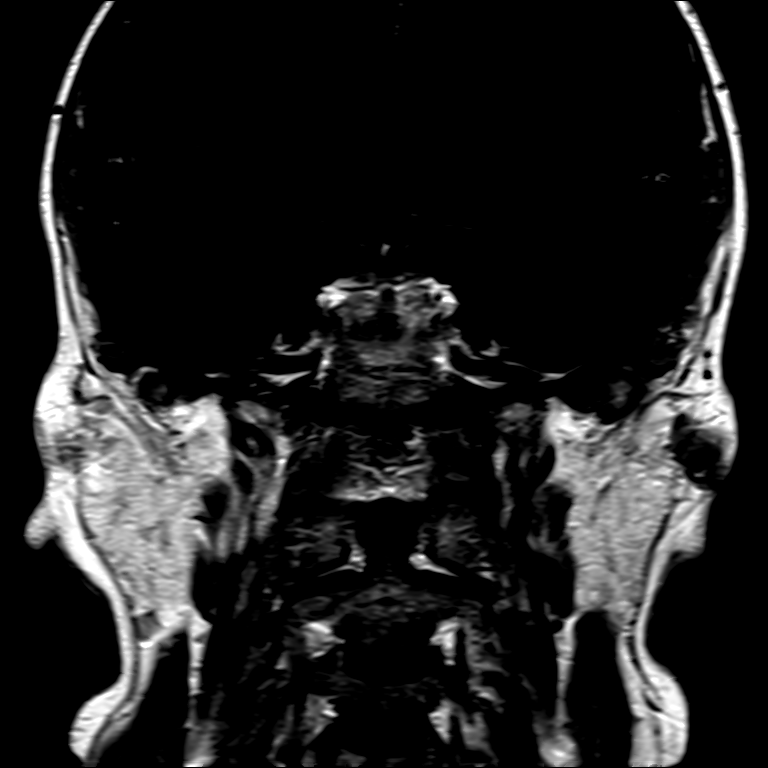

[Series 19: T1 post-contrast · axial · 1.0mm · 0.98mm/px · z∈[-124,+18]mm · 11 of 144 slices shown (3 of 3)]
[im 1/144]
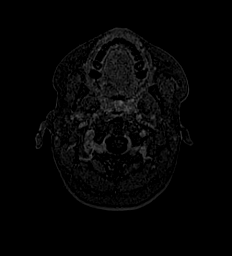
[im 15/144]
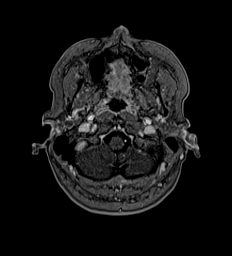
[im 29/144]
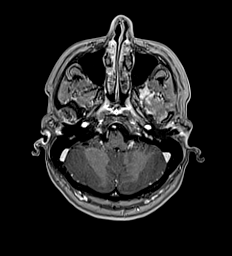
[im 43/144]
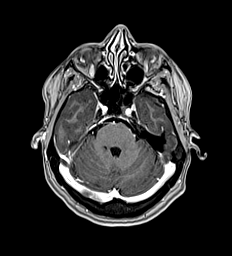
[im 58/144]
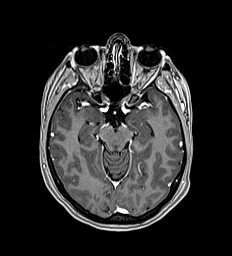
[im 72/144]
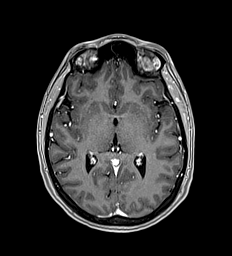
[im 86/144]
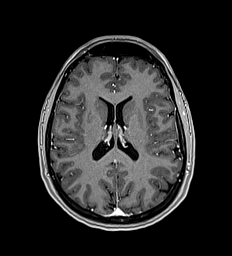
[im 101/144]
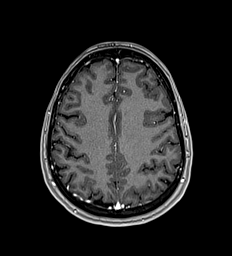
[im 115/144]
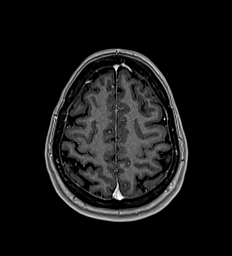
[im 129/144]
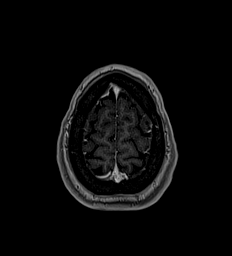
[im 144/144]
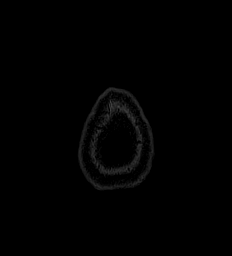

[28 of 48 positions shown; findings below may reference images not displayed]

FINDINGS: Brain: No acute infarct, acute hemorrhage or extra-axial collection.
Normal white matter signal. Normal volume of CSF spaces. No chronic
microhemorrhage. Normal midline structures. There is no
cerebellopontine angle mass. The cochleae and semicircular canals
are normal. No focal abnormality along the course of the 7th and 8th
cranial nerves. Normal porus acusticus and vestibular aqueduct
bilaterally.

Vascular: Normal flow voids.

Skull and upper cervical spine: Normal marrow signal.

Sinuses/Orbits: Negative.

Other: None.
IMPRESSION: Normal MRI of the brain and internal auditory canals. No focal
abnormality along the course of right cranial nerve 7.

## 2022-02-17 ENCOUNTER — Telehealth: Payer: Self-pay

## 2022-02-17 NOTE — Telephone Encounter (Signed)
Left vm and sent mychart message to confirm 02/24/22 appointment-[facility]

## 2022-02-24 ENCOUNTER — Encounter: Payer: Self-pay | Admitting: Physician Assistant

## 2022-02-24 ENCOUNTER — Ambulatory Visit (INDEPENDENT_AMBULATORY_CARE_PROVIDER_SITE_OTHER): Payer: 59 | Admitting: Physician Assistant

## 2022-02-24 VITALS — BP 120/68 | HR 75 | Temp 98.1°F | Resp 16 | Ht 61.0 in | Wt 187.0 lb

## 2022-02-24 DIAGNOSIS — Z803 Family history of malignant neoplasm of breast: Secondary | ICD-10-CM

## 2022-02-24 DIAGNOSIS — Z1231 Encounter for screening mammogram for malignant neoplasm of breast: Secondary | ICD-10-CM

## 2022-02-24 DIAGNOSIS — M7918 Myalgia, other site: Secondary | ICD-10-CM | POA: Diagnosis not present

## 2022-02-24 DIAGNOSIS — R1319 Other dysphagia: Secondary | ICD-10-CM | POA: Diagnosis not present

## 2022-02-24 DIAGNOSIS — G509 Disorder of trigeminal nerve, unspecified: Secondary | ICD-10-CM

## 2022-02-24 DIAGNOSIS — R300 Dysuria: Secondary | ICD-10-CM

## 2022-02-24 DIAGNOSIS — Z01419 Encounter for gynecological examination (general) (routine) without abnormal findings: Secondary | ICD-10-CM

## 2022-02-24 DIAGNOSIS — E538 Deficiency of other specified B group vitamins: Secondary | ICD-10-CM

## 2022-02-24 DIAGNOSIS — Z0001 Encounter for general adult medical examination with abnormal findings: Secondary | ICD-10-CM

## 2022-02-24 DIAGNOSIS — R200 Anesthesia of skin: Secondary | ICD-10-CM | POA: Diagnosis not present

## 2022-02-24 NOTE — Progress Notes (Signed)
[facility] [place], [place] [address]  Internal MEDICINE  Office Visit Note  Patient Name: Jamie Kennedy  [id number]  [id number]  Date of Service: 02/24/2022  Chief Complaint  Patient presents with   Annual Exam   Anxiety     HPI Pt is here for routine health maintenance examination -Continues to have myofacial pain and trigeminal neuralgia on right. Has seen many specialists and hasn't found any great solutions and this became costly. So mainly seeing osteopathic provider now -Has gained weight since all of this started -Many of her specialists worry about her having sleep disordered breathing and had HST that was negative for apnea, but still considering PSG to evaluate further for non-apnea disordered breathing -has contacted a myofacial specialist to see about therapy for tongue/speech/swallowing, but was OOP and expensive and interested in referral to speech therapy to work on improving function -maternal aunt (50-60yo) and Maternal GM both dx with breast cancer in the last year -UTD on pap, due in 2025 -Pt is on menstrual cycle today -Forgot to have labs done and will do so now--reprinted slip and given additional slip to add on b12/folate as well  Current Medication: Outpatient Encounter Medications as of 02/24/2022  Medication Sig   clonazePAM (KLONOPIN) 1 MG tablet Take 1/2 tablet to 1 tablet 2 hours before bedtime   fluticasone (FLONASE) 50 MCG/ACT nasal spray Place into the nose.   Melatonin 5 MG CAPS Take 5 mg by mouth at bedtime.   omeprazole (PRILOSEC) 20 MG capsule Take 1 capsule (20 mg total) by mouth daily.   VITAMIN D PO Take 1 tablet by mouth daily.   [DISCONTINUED] azelastine (ASTELIN) 0.1 % nasal spray Place into the nose. (Patient not taking: Reported on 02/24/2022)   [DISCONTINUED] famciclovir (FAMVIR) 500 MG tablet Take 1 tablet (500 mg total) by mouth 2 (two) times daily. (Patient not taking: Reported on 11/25/2021)    [DISCONTINUED] meclizine (ANTIVERT) 25 MG tablet Take 1 tablet (25 mg total) by mouth 3 (three) times daily as needed for dizziness. (Patient not taking: Reported on 02/24/2022)   No facility-administered encounter medications on file as of 02/24/2022.    Surgical History: History reviewed. No pertinent surgical history.  Medical History: Past Medical History:  Diagnosis Date   GAD (generalized anxiety disorder)    TMJ syndrome    Trigeminal neuralgia of right side of face     Family History: Family History  Problem Relation Age of Onset   Arthritis/Rheumatoid Mother    Ankylosing spondylitis Sister    Endometriosis Sister    Anxiety disorder Brother    Irritable bowel syndrome Brother    Kidney Stones Brother    Irritable bowel syndrome Brother    Breast cancer Maternal Aunt    Breast cancer Maternal Grandmother    Parkinson's disease Maternal Grandfather    Dementia Maternal Grandfather       Review of Systems  Constitutional:  Negative for chills, diaphoresis and fatigue.  HENT:  Negative for ear pain, postnasal drip and sinus pressure.   Eyes:  Negative for photophobia, discharge, redness, itching and visual disturbance.  Respiratory:  Negative for cough, shortness of breath and wheezing.   Cardiovascular:  Negative for chest pain, palpitations and leg swelling.  Gastrointestinal:  Negative for abdominal pain, constipation, diarrhea, nausea and vomiting.  Genitourinary:  Negative for dysuria and flank pain.  Musculoskeletal:  Negative for arthralgias, back pain, gait problem and neck pain.  Skin:  Negative for color  change.  Allergic/Immunologic: Negative for environmental allergies and food allergies.  Neurological:  Positive for facial asymmetry. Negative for dizziness and headaches.       Pain right side   Hematological:  Does not bruise/bleed easily.  Psychiatric/Behavioral:  Negative for agitation, behavioral problems (depression), hallucinations and sleep  disturbance. The patient is nervous/anxious.      Vital Signs: BP 120/68   Pulse 75   Temp 98.1 F (36.7 C)   Resp 16   Ht '5\' 1"'$  (1.549 m)   Wt 187 lb (84.8 kg)   SpO2 97%   BMI 35.33 kg/m    Physical Exam Constitutional:      Appearance: Normal appearance.  HENT:     Head: Normocephalic and atraumatic.     Mouth/Throat:     Mouth: Mucous membranes are moist.     Comments: Slight facial asymmetry  Eyes:     Extraocular Movements: Extraocular movements intact.     Pupils: Pupils are equal, round, and reactive to light.  Cardiovascular:     Rate and Rhythm: Normal rate and regular rhythm.  Pulmonary:     Effort: Pulmonary effort is normal.     Breath sounds: Normal breath sounds.  Chest:     Chest wall: No tenderness.  Breasts:    Right: Normal. No mass.     Left: Normal. No mass.  Abdominal:     General: Abdomen is flat.     Palpations: Abdomen is soft.     Tenderness: There is no abdominal tenderness.  Musculoskeletal:        General: Normal range of motion.  Skin:    General: Skin is warm and dry.  Neurological:     Mental Status: She is alert and oriented to person, place, and time.  Psychiatric:        Mood and Affect: Mood normal.        Behavior: Behavior normal.        Thought Content: Thought content normal.        Judgment: Judgment normal.      LABS: No results found for this or any previous visit (from the past 2160 hour(s)).      Assessment/Plan: 1. Encounter for general adult medical examination with abnormal findings CPE performed, UTD on pap, will go for routine fasting labs previously ordered with add on B12/folate  2. Myofascial pain on right side Followed by several specialists without significant change. Will refer to speech therapy - Ambulatory referral to Speech Therapy  3. Right facial numbness Will refer to speech therapy - Ambulatory referral to Speech Therapy  4. Trigeminal nerve disease or syndrome Will refer to  speech therapy - Ambulatory referral to Speech Therapy  5. Other dysphagia Will refer to speech therapy - Ambulatory referral to Speech Therapy  6. Family history of breast cancer - MM DIGITAL SCREENING BILATERAL; Future  7. Visit for screening mammogram - MM DIGITAL SCREENING BILATERAL; Future  8. Visit for gynecologic examination Breast exam performed  9. B12 deficiency - B12 and Folate Panel  10. Dysuria - UA/M w/rflx Culture, Routine   General Counseling: Evangelynn verbalizes understanding of the findings of todays visit and agrees with plan of treatment. I have discussed any further diagnostic evaluation that may be needed or ordered today. We also reviewed her medications today. she has been encouraged to call the office with any questions or concerns that should arise related to todays visit.    Counseling:    Orders Placed This  Encounter  Procedures   MM DIGITAL SCREENING BILATERAL   UA/M w/rflx Culture, Routine   B12 and Folate Panel   Ambulatory referral to Speech Therapy    No orders of the defined types were placed in this encounter.   This patient was seen by Drema Dallas, PA-C in collaboration with Dr. Clayborn Bigness as a part of collaborative care agreement.  Total time spent:35 Minutes  Time spent includes review of chart, medications, test results, and follow up plan with the patient.     Lavera Guise, MD  Internal Medicine

## 2022-02-25 LAB — UA/M W/RFLX CULTURE, ROUTINE
Bilirubin, UA: NEGATIVE
Glucose, UA: NEGATIVE
Ketones, UA: NEGATIVE
Leukocytes,UA: NEGATIVE
Nitrite, UA: NEGATIVE
Protein,UA: NEGATIVE
RBC, UA: NEGATIVE
Specific Gravity, UA: <=1.005 — AB (ref 1.005–1.030)
Urobilinogen, Ur: 0.2 mg/dL (ref 0.2–1.0)
pH, UA: 7 (ref 5.0–7.5)

## 2022-02-25 LAB — MICROSCOPIC EXAMINATION
Bacteria, UA: NONE SEEN
Casts: NONE SEEN /lpf
Epithelial Cells (non renal): NONE SEEN /hpf (ref 0–10)
RBC, Urine: NONE SEEN /hpf (ref 0–2)
WBC, UA: NONE SEEN /hpf (ref 0–5)

## 2022-03-02 LAB — CMP14+EGFR
ALT: 14 IU/L (ref 0–32)
AST: 12 IU/L (ref 0–40)
Albumin/Globulin Ratio: 2.3 — ABNORMAL HIGH (ref 1.2–2.2)
Albumin: 4.3 g/dL (ref 3.9–4.9)
Alkaline Phosphatase: 83 IU/L (ref 44–121)
BUN/Creatinine Ratio: 11 (ref 9–23)
BUN: 7 mg/dL (ref 6–20)
Bilirubin Total: 0.3 mg/dL (ref 0.0–1.2)
CO2: 22 mmol/L (ref 20–29)
Calcium: 9 mg/dL (ref 8.7–10.2)
Chloride: 104 mmol/L (ref 96–106)
Creatinine, Ser: 0.66 mg/dL (ref 0.57–1.00)
Globulin, Total: 1.9 g/dL (ref 1.5–4.5)
Glucose: 98 mg/dL (ref 70–99)
Potassium: 4.1 mmol/L (ref 3.5–5.2)
Sodium: 139 mmol/L (ref 134–144)
Total Protein: 6.2 g/dL (ref 6.0–8.5)
eGFR: 117 mL/min/{1.73_m2} (ref >59)

## 2022-03-02 LAB — B12 AND FOLATE PANEL
Folate: 15.7 ng/mL (ref >3.0)
Vitamin B-12: 809 pg/mL (ref 232–1245)

## 2022-03-02 LAB — CBC WITH DIFFERENTIAL/PLATELET
Basophils Absolute: 0.1 10*3/uL (ref 0.0–0.2)
Basos: 1 %
EOS (ABSOLUTE): 0.1 10*3/uL (ref 0.0–0.4)
Eos: 1 %
Hematocrit: 39.6 % (ref 34.0–46.6)
Hemoglobin: 13.3 g/dL (ref 11.1–15.9)
Immature Grans (Abs): 0 10*3/uL (ref 0.0–0.1)
Immature Granulocytes: 0 %
Lymphocytes Absolute: 2.1 10*3/uL (ref 0.7–3.1)
Lymphs: 28 %
MCH: 28.9 pg (ref 26.6–33.0)
MCHC: 33.6 g/dL (ref 31.5–35.7)
MCV: 86 fL (ref 79–97)
Monocytes Absolute: 0.5 10*3/uL (ref 0.1–0.9)
Monocytes: 6 %
Neutrophils Absolute: 4.7 10*3/uL (ref 1.4–7.0)
Neutrophils: 64 %
Platelets: 232 10*3/uL (ref 150–450)
RBC: 4.6 x10E6/uL (ref 3.77–5.28)
RDW: 12.3 % (ref 11.7–15.4)
WBC: 7.4 10*3/uL (ref 3.4–10.8)

## 2022-03-02 LAB — LIPID PANEL
Chol/HDL Ratio: 4.3 ratio (ref 0.0–4.4)
Cholesterol, Total: 154 mg/dL (ref 100–199)
HDL: 36 mg/dL — ABNORMAL LOW (ref >39)
LDL Chol Calc (NIH): 82 mg/dL (ref 0–99)
Triglycerides: 211 mg/dL — ABNORMAL HIGH (ref 0–149)
VLDL Cholesterol Cal: 36 mg/dL (ref 5–40)

## 2022-03-02 LAB — VITAMIN D 25 HYDROXY (VIT D DEFICIENCY, FRACTURES): Vit D, 25-Hydroxy: 25.5 ng/mL — ABNORMAL LOW (ref 30.0–100.0)

## 2022-03-02 LAB — TSH+FREE T4
Free T4: 1.19 ng/dL (ref 0.82–1.77)
TSH: 1.56 u[IU]/mL (ref 0.450–4.500)

## 2022-03-09 ENCOUNTER — Telehealth: Payer: Self-pay

## 2022-03-09 NOTE — Telephone Encounter (Signed)
Speech therapy referral faxed to [facility] [place]-[facility]

## 2022-03-09 NOTE — Telephone Encounter (Signed)
-----   Message from Mylinda Latina, PA-C sent at 03/09/2022  1:42 PM EDT ----- Please let her know that her Vit D is still low and to keep supplementing. Her TG were also a little elevated and continue to work on diet and exercise.

## 2022-03-09 NOTE — Telephone Encounter (Signed)
Pt advised about labs see other note and also pt was not fasting before labs

## 2022-08-25 ENCOUNTER — Ambulatory Visit: Payer: 59 | Admitting: Physician Assistant

## 2023-03-02 ENCOUNTER — Ambulatory Visit (INDEPENDENT_AMBULATORY_CARE_PROVIDER_SITE_OTHER): Payer: 59 | Admitting: Physician Assistant

## 2023-03-02 ENCOUNTER — Encounter: Payer: Self-pay | Admitting: Physician Assistant

## 2023-03-02 ENCOUNTER — Telehealth: Payer: Self-pay | Admitting: Physician Assistant

## 2023-03-02 VITALS — BP 115/80 | HR 84 | Temp 97.7°F | Resp 16 | Ht 61.0 in | Wt 191.0 lb

## 2023-03-02 DIAGNOSIS — M2550 Pain in unspecified joint: Secondary | ICD-10-CM

## 2023-03-02 DIAGNOSIS — E782 Mixed hyperlipidemia: Secondary | ICD-10-CM | POA: Diagnosis not present

## 2023-03-02 DIAGNOSIS — R5383 Other fatigue: Secondary | ICD-10-CM

## 2023-03-02 DIAGNOSIS — Z0001 Encounter for general adult medical examination with abnormal findings: Secondary | ICD-10-CM

## 2023-03-02 DIAGNOSIS — E559 Vitamin D deficiency, unspecified: Secondary | ICD-10-CM

## 2023-03-02 DIAGNOSIS — R300 Dysuria: Secondary | ICD-10-CM

## 2023-03-02 DIAGNOSIS — E669 Obesity, unspecified: Secondary | ICD-10-CM

## 2023-03-02 DIAGNOSIS — R946 Abnormal results of thyroid function studies: Secondary | ICD-10-CM

## 2023-03-02 DIAGNOSIS — E538 Deficiency of other specified B group vitamins: Secondary | ICD-10-CM

## 2023-03-02 DIAGNOSIS — G4719 Other hypersomnia: Secondary | ICD-10-CM

## 2023-03-02 NOTE — Telephone Encounter (Addendum)
SS order placed in [facility] folder-[facility] 

## 2023-03-02 NOTE — Progress Notes (Signed)
[facility] [address] [place], [place] [address]  Internal MEDICINE  Office Visit Note  Patient Name: Jamie Kennedy  [id number]  [id number]  Date of Service: 03/02/2023  Chief Complaint  Patient presents with   Annual Exam     HPI Pt is here for routine health maintenance examination -last pap 2020, due next year  -working at [employer], smaller practice. Likes her work -Had been to ortho for bilateral knee pain/swelling after starting exercise and stopped which helped some. Went to beach a little while back and her legs were swollen again and had to ice and rest for it to go away. Happened again on a camping trip and some swelling again. Seems be activity related but mom also has RA and may want to recheck labs as swelling is not happening any other time and would like to be able to exercise without this occurring -taking omeprazole every other day, unable to stop completely as she has reflux still -Due to her history she continues to have jaw/temple pain and has seen many specialists. Does struggle with sleep still and was told she has a narrow airway. Does snore loudly, especially on her back. Previously had a HST that was negative. Would be interested in retesting before looking into oral appliance for grinding to see if it also needs to be for OSA or not. Does feel tired during the day with poorly refreshing sleep.   EPWORTH SLEEPINESS SCALE:  Scale:  (0)= no chance of dozing; (1)= slight chance of dozing; (2)= moderate chance of dozing; (3)= high chance of dozing  Chance  Situtation    Sitting and reading: 2    Watching TV: 1    Sitting Inactive in public: 0    As a passenger in car: 1      Lying down to rest: 2    Sitting and talking: 0    Sitting quielty after lunch: 0    In a car, stopped in traffic: 0   TOTAL SCORE:   6 out of 24   Current Medication: Outpatient Encounter Medications as of 03/02/2023  Medication Sig    Melatonin 5 MG CAPS Take 5 mg by mouth at bedtime.   omeprazole (PRILOSEC) 20 MG capsule Take 1 capsule (20 mg total) by mouth daily.   VITAMIN D PO Take 1 tablet by mouth daily.   [DISCONTINUED] clonazePAM (KLONOPIN) 1 MG tablet Take 1/2 tablet to 1 tablet 2 hours before bedtime   fluticasone (FLONASE) 50 MCG/ACT nasal spray Place into the nose.   No facility-administered encounter medications on file as of 03/02/2023.    Surgical History: History reviewed. No pertinent surgical history.  Medical History: Past Medical History:  Diagnosis Date   GAD (generalized anxiety disorder)    TMJ syndrome    Trigeminal neuralgia of right side of face     Family History: Family History  Problem Relation Age of Onset   Arthritis/Rheumatoid Mother    Ankylosing spondylitis Sister    Endometriosis Sister    Anxiety disorder Brother    Irritable bowel syndrome Brother    Kidney Stones Brother    Irritable bowel syndrome Brother    Breast cancer Maternal Aunt    Breast cancer Maternal Grandmother    Parkinson's disease Maternal Grandfather    Dementia Maternal Grandfather       Review of Systems  Constitutional:  Positive for fatigue. Negative for chills and diaphoresis.  HENT:  Negative for ear pain, postnasal  drip and sinus pressure.   Eyes:  Negative for photophobia, discharge, redness, itching and visual disturbance.  Respiratory:  Negative for cough, shortness of breath and wheezing.   Cardiovascular:  Negative for chest pain, palpitations and leg swelling.  Gastrointestinal:  Negative for abdominal pain, constipation, diarrhea, nausea and vomiting.  Genitourinary:  Negative for dysuria and flank pain.  Musculoskeletal:  Positive for arthralgias. Negative for back pain, gait problem and neck pain.  Skin:  Negative for color change.  Allergic/Immunologic: Negative for environmental allergies and food allergies.  Neurological:  Negative for dizziness and headaches.       Continued  right side facial/jaw pain  Hematological:  Does not bruise/bleed easily.  Psychiatric/Behavioral:  Positive for sleep disturbance. Negative for agitation, behavioral problems (depression) and hallucinations.      Vital Signs: BP 115/80   Pulse 84   Temp 97.7 F (36.5 C)   Resp 16   Ht 5\' 1"  (1.549 m)   Wt 191 lb (86.6 kg)   SpO2 97%   BMI 36.09 kg/m    Physical Exam Vitals and nursing note reviewed.  Constitutional:      Appearance: Normal appearance.  HENT:     Head: Normocephalic and atraumatic.     Mouth/Throat:     Mouth: Mucous membranes are moist.  Eyes:     Extraocular Movements: Extraocular movements intact.     Pupils: Pupils are equal, round, and reactive to light.  Cardiovascular:     Rate and Rhythm: Normal rate and regular rhythm.  Pulmonary:     Effort: Pulmonary effort is normal.     Breath sounds: Normal breath sounds.  Chest:     Chest wall: No tenderness.  Breasts:    Right: Normal. No mass.     Left: Normal. No mass.  Abdominal:     General: Abdomen is flat.     Palpations: Abdomen is soft.     Tenderness: There is no abdominal tenderness.  Musculoskeletal:        General: Normal range of motion.  Skin:    General: Skin is warm and dry.  Neurological:     Mental Status: She is alert and oriented to person, place, and time.  Psychiatric:        Mood and Affect: Mood normal.        Behavior: Behavior normal.        Thought Content: Thought content normal.        Judgment: Judgment normal.      LABS: No results found for this or any previous visit (from the past 2160 hour(s)).      Assessment/Plan: 1. Encounter for general adult medical examination with abnormal findings CPE performed, labs ordered  2. Polyarthralgia Will check labs - Rheumatoid Factor - ANA w/Reflex if Positive - Sed Rate (ESR)  3. Other hypersomnia Due to long history of snoring, fatigue, poorly refreshing sleep, and elevated BMI will order in-lab PSG. -  PSG SLEEP STUDY; Future  4. Vitamin D deficiency - VITAMIN D 25 Hydroxy (Vit-D Deficiency, Fractures)  5. Mixed hyperlipidemia - Lipid Panel With LDL/HDL Ratio  6. Abnormal thyroid exam - TSH + free T4  7. B12 deficiency - B12 and Folate Panel  8. Other fatigue - CBC w/Diff/Platelet - Comprehensive metabolic panel - TSH + free T4 - Lipid Panel With LDL/HDL Ratio - VITAMIN D 25 Hydroxy (Vit-D Deficiency, Fractures) - B12 and Folate Panel  9. Dysuria - UA/M w/rflx Culture, Routine  10. Obesity (  BMI 30-39.9) Will order PSG     General Counseling: Charliene verbalizes understanding of the findings of todays visit and agrees with plan of treatment. I have discussed any further diagnostic evaluation that may be needed or ordered today. We also reviewed her medications today. she has been encouraged to call the office with any questions or concerns that should arise related to todays visit.    Counseling:    Orders Placed This Encounter  Procedures   UA/M w/rflx Culture, Routine   CBC w/Diff/Platelet   Comprehensive metabolic panel   TSH + free T4   Lipid Panel With LDL/HDL Ratio   VITAMIN D 25 Hydroxy (Vit-D Deficiency, Fractures)   B12 and Folate Panel   Rheumatoid Factor   ANA w/Reflex if Positive   Sed Rate (ESR)   PSG SLEEP STUDY    No orders of the defined types were placed in this encounter.   This patient was seen by Lynn Ito, PA-C in collaboration with Dr. Beverely Risen as a part of collaborative care agreement.  Total time spent:40 Minutes  Time spent includes review of chart, medications, test results, and follow up plan with the patient.     Lyndon Code, MD  Internal Medicine

## 2023-03-03 LAB — UA/M W/RFLX CULTURE, ROUTINE
Bilirubin, UA: NEGATIVE
Glucose, UA: NEGATIVE
Ketones, UA: NEGATIVE
Leukocytes,UA: NEGATIVE
Nitrite, UA: NEGATIVE
Protein,UA: NEGATIVE
RBC, UA: NEGATIVE
Specific Gravity, UA: 1.007 (ref 1.005–1.030)
Urobilinogen, Ur: 0.2 mg/dL (ref 0.2–1.0)
pH, UA: 7.5 (ref 5.0–7.5)

## 2023-03-03 LAB — MICROSCOPIC EXAMINATION
Bacteria, UA: NONE SEEN
Casts: NONE SEEN /lpf
Epithelial Cells (non renal): NONE SEEN /hpf (ref 0–10)
WBC, UA: NONE SEEN /hpf (ref 0–5)

## 2023-03-08 ENCOUNTER — Telehealth: Payer: Self-pay | Admitting: Physician Assistant

## 2023-03-08 NOTE — Telephone Encounter (Signed)
SS order faxed to [facility]; [phone]

## 2023-03-14 ENCOUNTER — Telehealth: Payer: Self-pay

## 2023-03-14 NOTE — Telephone Encounter (Signed)
-----   Message from Carlean Jews sent at 03/13/2023  1:09 PM EDT ----- Please let her know her Vit D is still low and should increase supplement. B12 was a little low and could consider B12 shots. Otherwise labs look good.

## 2023-03-14 NOTE — Telephone Encounter (Signed)
Left message and sent MyChart for patient regarding lab results.

## 2023-04-28 ENCOUNTER — Telehealth: Payer: Self-pay | Admitting: Physician Assistant

## 2023-04-28 NOTE — Telephone Encounter (Signed)
Patient has not returned call to [facility] to schedule appt. I lvm and sent message to patient-Jamie Kennedy

## 2023-07-07 ENCOUNTER — Telehealth: Payer: Self-pay | Admitting: Physician Assistant

## 2023-07-07 NOTE — Telephone Encounter (Signed)
[facility] unable to reach patient to schedule SS ordered 03/02/23-Toni

## 2024-02-27 ENCOUNTER — Telehealth: Payer: Self-pay | Admitting: Physician Assistant

## 2024-02-27 NOTE — Telephone Encounter (Signed)
 Left vm and sent mychart message to confirm 03/04/24 appointment-Toni

## 2024-03-04 ENCOUNTER — Encounter: Payer: 59 | Admitting: Physician Assistant

## 2024-04-04 ENCOUNTER — Telehealth: Payer: Self-pay | Admitting: Physician Assistant

## 2024-04-04 NOTE — Telephone Encounter (Signed)
 Left vm and sent mychart message to confirm 04/11/24 appointment-Toni

## 2024-04-11 ENCOUNTER — Ambulatory Visit (INDEPENDENT_AMBULATORY_CARE_PROVIDER_SITE_OTHER): Payer: Self-pay | Admitting: Physician Assistant

## 2024-04-11 ENCOUNTER — Encounter: Payer: Self-pay | Admitting: Physician Assistant

## 2024-04-11 VITALS — BP 125/70 | HR 89 | Temp 97.9°F | Resp 16 | Ht 61.0 in | Wt 190.0 lb

## 2024-04-11 DIAGNOSIS — E669 Obesity, unspecified: Secondary | ICD-10-CM

## 2024-04-11 DIAGNOSIS — K219 Gastro-esophageal reflux disease without esophagitis: Secondary | ICD-10-CM | POA: Diagnosis not present

## 2024-04-11 DIAGNOSIS — E782 Mixed hyperlipidemia: Secondary | ICD-10-CM

## 2024-04-11 DIAGNOSIS — M2550 Pain in unspecified joint: Secondary | ICD-10-CM | POA: Diagnosis not present

## 2024-04-11 DIAGNOSIS — Z124 Encounter for screening for malignant neoplasm of cervix: Secondary | ICD-10-CM | POA: Diagnosis not present

## 2024-04-11 DIAGNOSIS — E538 Deficiency of other specified B group vitamins: Secondary | ICD-10-CM

## 2024-04-11 DIAGNOSIS — Z0001 Encounter for general adult medical examination with abnormal findings: Secondary | ICD-10-CM

## 2024-04-11 DIAGNOSIS — E559 Vitamin D deficiency, unspecified: Secondary | ICD-10-CM

## 2024-04-11 DIAGNOSIS — R946 Abnormal results of thyroid function studies: Secondary | ICD-10-CM

## 2024-04-11 MED ORDER — OMEPRAZOLE 20 MG PO CPDR: 20.0000 mg | DELAYED_RELEASE_CAPSULE | Freq: Every day | ORAL | 3 refills | Status: AC

## 2024-04-11 NOTE — Progress Notes (Signed)
 [facility] [address] [place], [place] [address]  Internal MEDICINE  Office Visit Note  Patient Name: Jamie Kennedy  [id number]  [id number]  Date of Service: 04/11/2024  Chief Complaint  Patient presents with   Annual Exam     HPI Pt is here for routine health maintenance examination -started back walking recently with weather changes, but avoids this typically due to chronic stiffness. Considering PT -frustrated with trying to lose weight, last year was tracking for 4 months to 1400 cal and only lost 5lbs over this time. Discouraged with the results. Open to nutritionist. Declines weight meds at this time -never got sleep study, due to a number of events: whole house get covid, and then daughter having surgery and then ended up taking in a grandbaby from her son. Does still have poor sleep and worsening snoring on her back. She avoids sleeping on back for this reason. Due to jaw collapse/pain would likely not be able to do cpap but would guide oral appliance -Sibling with ankylosing spondylitis, and another family member with RF -gerd since 2021, taking omeprazole  every other day OTC. The past year more reflux/cough each morning, so started back daily and it got better but when she ran out of one brand and new brand did not help then. Would like to send script for 20mg  to try. Then also having more mucus in bowel movements as well  Current Medication: Outpatient Encounter Medications as of 04/11/2024  Medication Sig   Melatonin 5 MG CAPS Take 5 mg by mouth at bedtime.   omeprazole  (PRILOSEC) 20 MG capsule Take 1 capsule (20 mg total) by mouth daily.   VITAMIN D  PO Take 1 tablet by mouth daily.   [DISCONTINUED] fluticasone (FLONASE) 50 MCG/ACT nasal spray Place into the nose.   [DISCONTINUED] omeprazole  (PRILOSEC) 20 MG capsule Take 1 capsule (20 mg total) by mouth daily.   No facility-administered encounter medications on file as of 04/11/2024.    Surgical  History: History reviewed. No pertinent surgical history.  Medical History: Past Medical History:  Diagnosis Date   GAD (generalized anxiety disorder)    TMJ syndrome    Trigeminal neuralgia of right side of face     Family History: Family History  Problem Relation Age of Onset   Arthritis/Rheumatoid Mother    Ankylosing spondylitis Sister    Endometriosis Sister    Anxiety disorder Brother    Irritable bowel syndrome Brother    Kidney Stones Brother    Irritable bowel syndrome Brother    Breast cancer Maternal Aunt    Breast cancer Maternal Grandmother    Parkinson's disease Maternal Grandfather    Dementia Maternal Grandfather       Review of Systems  Constitutional:  Positive for fatigue. Negative for chills and diaphoresis.  HENT:  Negative for ear pain, postnasal drip and sinus pressure.   Eyes:  Negative for photophobia, discharge, redness, itching and visual disturbance.  Respiratory:  Negative for cough, shortness of breath and wheezing.   Cardiovascular:  Negative for chest pain, palpitations and leg swelling.  Gastrointestinal:  Negative for abdominal pain, constipation, diarrhea, nausea and vomiting.       Reflux  Genitourinary:  Negative for dysuria and flank pain.  Musculoskeletal:  Positive for arthralgias. Negative for back pain, gait problem and neck pain.  Skin:  Negative for color change.  Allergic/Immunologic: Negative for environmental allergies and food allergies.  Neurological:  Negative for dizziness and headaches.  Continued right side facial/jaw pain  Hematological:  Does not bruise/bleed easily.  Psychiatric/Behavioral:  Positive for sleep disturbance. Negative for agitation, behavioral problems (depression) and hallucinations.      Vital Signs: BP 125/70   Pulse 89   Temp 97.9 F (36.6 C)   Resp 16   Ht 5' 1 (1.549 m)   Wt 190 lb (86.2 kg)   SpO2 98%   BMI 35.90 kg/m    Physical Exam Vitals and nursing note reviewed.   Constitutional:      Appearance: Normal appearance.  HENT:     Head: Normocephalic and atraumatic.     Mouth/Throat:     Mouth: Mucous membranes are moist.  Eyes:     Extraocular Movements: Extraocular movements intact.     Pupils: Pupils are equal, round, and reactive to light.  Cardiovascular:     Rate and Rhythm: Normal rate and regular rhythm.  Pulmonary:     Effort: Pulmonary effort is normal.     Breath sounds: Normal breath sounds.  Chest:     Chest wall: No tenderness.  Breasts:    Right: Normal. No mass.     Left: Normal. No mass.  Abdominal:     General: Abdomen is flat.     Palpations: Abdomen is soft.     Tenderness: There is no abdominal tenderness.  Genitourinary:    Comments: Pap performed Musculoskeletal:        General: Normal range of motion.  Skin:    General: Skin is warm and dry.  Neurological:     Mental Status: She is alert and oriented to person, place, and time.  Psychiatric:        Mood and Affect: Mood normal.        Behavior: Behavior normal.        Thought Content: Thought content normal.        Judgment: Judgment normal.      LABS: Recent Results (from the past 2160 hours)  IGP, Aptima HPV     Status: None   Collection Time: 04/11/24  3:27 PM  Result Value Ref Range   Interpretation NILM     Comment: NEGATIVE FOR INTRAEPITHELIAL LESION OR MALIGNANCY.   Category NIL     Comment: Negative for Intraepithelial Lesion   Adequacy ENDO     Comment: Satisfactory for evaluation. Endocervical and/or squamous metaplastic cells (endocervical component) are present.    Clinician Provided ICD10 Comment     Comment: Z12.4   Performed by: Comment     Comment: Rilla Cassis, BS, Cytologist (ASCP)   Note: Comment     Comment: The Pap smear is a screening test designed to aid in the detection of premalignant and malignant conditions of the uterine cervix.  It is not a diagnostic procedure and should not be used as the sole means of  detecting cervical cancer.  Both false-positive and false-negative reports do occur.    Test Methodology Comment     Comment: This liquid based ThinPrep(R) pap test was interpreted using the Hologic(R) Genius(TM) Cervical Algorithm whole slide imaging system.    HPV Aptima Negative Negative    Comment: This nucleic acid amplification test detects fourteen high-risk HPV types (16,18,31,33,35,39,45,51,52,56,58,59,66,68) without differentiation.   CBC with Differential/Platelet     Status: None   Collection Time: 04/17/24  8:44 AM  Result Value Ref Range   WBC 7.5 3.4 - 10.8 x10E3/uL   RBC 4.56 3.77 - 5.28 x10E6/uL   Hemoglobin 13.2 11.1 -  15.9 g/dL   Hematocrit 59.5 65.9 - 46.6 %   MCV 89 79 - 97 fL   MCH 28.9 26.6 - 33.0 pg   MCHC 32.7 31.5 - 35.7 g/dL   RDW 87.5 88.2 - 84.5 %   Platelets 240 150 - 450 x10E3/uL   Neutrophils 65 Not Estab. %   Lymphs 26 Not Estab. %   Monocytes 7 Not Estab. %   Eos 1 Not Estab. %   Basos 1 Not Estab. %   Neutrophils Absolute 4.9 1.4 - 7.0 x10E3/uL   Lymphocytes Absolute 1.9 0.7 - 3.1 x10E3/uL   Monocytes Absolute 0.5 0.1 - 0.9 x10E3/uL   EOS (ABSOLUTE) 0.1 0.0 - 0.4 x10E3/uL   Basophils Absolute 0.0 0.0 - 0.2 x10E3/uL   Immature Granulocytes 0 Not Estab. %   Immature Grans (Abs) 0.0 0.0 - 0.1 x10E3/uL  Comprehensive metabolic panel with GFR     Status: None   Collection Time: 04/17/24  8:44 AM  Result Value Ref Range   Glucose 97 70 - 99 mg/dL   BUN 10 6 - 20 mg/dL   Creatinine, Ser 9.23 0.57 - 1.00 mg/dL   eGFR 896 >40 fO/fpw/8.26   BUN/Creatinine Ratio 13 9 - 23   Sodium 139 134 - 144 mmol/L   Potassium 4.2 3.5 - 5.2 mmol/L   Chloride 104 96 - 106 mmol/L   CO2 22 20 - 29 mmol/L   Calcium 9.2 8.7 - 10.2 mg/dL   Total Protein 6.5 6.0 - 8.5 g/dL   Albumin 4.2 3.9 - 4.9 g/dL   Globulin, Total 2.3 1.5 - 4.5 g/dL   Bilirubin Total 0.4 0.0 - 1.2 mg/dL   Alkaline Phosphatase 71 44 - 121 IU/L    Comment: **Effective April 29, 2024  Alkaline Phosphatase**   reference interval will be changing to:              Age                Female          Female           0 -  5 days         47 - 127       47 - 127           6 - 10 days         29 - 242       29 - 242          11 - 20 days        109 - 357      109 - 357          21 - 30 days         94 - 494       94 - 494           1 -  2 months      149 - 539      149 - 539           3 -  6 months      131 - 452      131 - 452           7 - 11 months      117 - 401      117 - 401   12 months -  6 years       158 - 369  158 - 369           7 - 12 years       150 - 409      150 - 409               13 years       156 - 435       78 - 227               14 years       114 - 375       64 - 161               15 years        88 - 279       56 - 134               16 years        74 - 207       51 - 121               17 years        63 - 161       47 - 113          18 - 20 years        51 - 125       42 - 106          21 - 50 years         47 - 123       41 - 116          51 - 80 years        49 - 135       51 - 125              >80 years        48 - 129       48 - 129    AST 10 0 - 40 IU/L   ALT 10 0 - 32 IU/L  Lipid Panel With LDL/HDL Ratio     Status: Abnormal   Collection Time: 04/17/24  8:44 AM  Result Value Ref Range   Cholesterol, Total 143 100 - 199 mg/dL   Triglycerides 888 0 - 149 mg/dL   HDL 34 (L) >60 mg/dL   VLDL Cholesterol Cal 20 5 - 40 mg/dL   LDL Chol Calc (NIH) 89 0 - 99 mg/dL   LDL/HDL Ratio 2.6 0.0 - 3.2 ratio    Comment:                                     LDL/HDL Ratio                                             Men  Women                               1/2 Avg.Risk  1.0    1.5                                   Avg.Risk  3.6  3.2                                2X Avg.Risk  6.2    5.0                                3X Avg.Risk  8.0    6.1   B12 and Folate Panel     Status: None   Collection Time: 04/17/24  8:44 AM  Result Value Ref Range   Vitamin  B-12 381 232 - 1,245 pg/mL   Folate 6.8 >3.0 ng/mL    Comment: A serum folate concentration of less than 3.1 ng/mL is considered to represent clinical deficiency.   Hgb A1c w/o eAG     Status: None   Collection Time: 04/17/24  8:44 AM  Result Value Ref Range   Hgb A1c MFr Bld 5.3 4.8 - 5.6 %    Comment:          Prediabetes: 5.7 - 6.4          Diabetes: >6.4          Glycemic control for adults with diabetes: <7.0   T4, free     Status: None   Collection Time: 04/17/24  8:44 AM  Result Value Ref Range   Free T4 1.25 0.82 - 1.77 ng/dL  TSH     Status: None   Collection Time: 04/17/24  8:44 AM  Result Value Ref Range   TSH 1.850 0.450 - 4.500 uIU/mL  VITAMIN D  25 Hydroxy (Vit-D Deficiency, Fractures)     Status: Abnormal   Collection Time: 04/17/24  8:44 AM  Result Value Ref Range   Vit D, 25-Hydroxy 29.8 (L) 30.0 - 100.0 ng/mL    Comment: Vitamin D  deficiency has been defined by the Institute of Medicine and an Endocrine Society practice guideline as a level of serum 25-OH vitamin D  less than 20 ng/mL (1,2). The Endocrine Society went on to further define vitamin D  insufficiency as a level between 21 and 29 ng/mL (2). 1. IOM (Institute of Medicine). 2010. Dietary reference    intakes for calcium and D. [place]: The    [employer]. 2. Holick MF, Binkley [place], Bischoff-Ferrari HA, et al.    Evaluation, treatment, and prevention of vitamin D     deficiency: an Endocrine Society clinical practice    guideline. JCEM. 2011 Jul; 96(7):1911-30.   ANA, IFA (with reflex)     Status: None   Collection Time: 04/17/24  8:44 AM  Result Value Ref Range   ANA Titer 1 Negative     Comment:                                      Negative   <1:80                                      Borderline  1:80                                      Positive   >1:80 ICAP nomenclature: AC-0 For more information about Hep-2 cell  patterns use ANApatterns.org, the official website for the  International Consensus on Antinuclear Antibody (ANA) Patterns (ICAP).         Assessment/Plan: 1. Encounter for general adult medical examination with abnormal findings (Primary) Cpe performed, lab slip given, pap updated  2. Gastroesophageal reflux disease without esophagitis Will start omeprazole  - omeprazole  (PRILOSEC) 20 MG capsule; Take 1 capsule (20 mg total) by mouth daily.  Dispense: 90 capsule; Refill: 3  3. Routine cervical smear - IGP, Aptima HPV  4. Polyarthralgia Will recheck labs given Fhx of autoimmune disorders  5. Obesity (BMI 30-39.9) Will refer to nutritionist to help guide diet changes to assist weight loss goals - Consult to Registered Dietitian - Amb ref to Medical Nutrition Therapy-MNT    General Counseling: Jennfier verbalizes understanding of the findings of todays visit and agrees with plan of treatment. I have discussed any further diagnostic evaluation that may be needed or ordered today. We also reviewed her medications today. she has been encouraged to call the office with any questions or concerns that should arise related to todays visit.    Counseling:    Orders Placed This Encounter  Procedures   Amb ref to Medical Nutrition Therapy-MNT   Consult to Registered Dietitian    Meds ordered this encounter  Medications   omeprazole  (PRILOSEC) 20 MG capsule    Sig: Take 1 capsule (20 mg total) by mouth daily.    Dispense:  90 capsule    Refill:  3    This patient was seen by Tinnie Pro, PA-C in collaboration with Dr. Sigrid Bathe as a part of collaborative care agreement.  Total time spent:40 Minutes  Time spent includes review of chart, medications, test results, and follow up plan with the patient.     Sigrid CHRISTELLA Bathe, MD  Internal Medicine

## 2024-04-16 ENCOUNTER — Ambulatory Visit: Payer: Self-pay | Admitting: Physician Assistant

## 2024-04-16 LAB — IGP, APTIMA HPV: HPV Aptima: NEGATIVE

## 2024-04-17 ENCOUNTER — Other Ambulatory Visit: Payer: Self-pay | Admitting: Physician Assistant

## 2024-04-17 NOTE — Telephone Encounter (Signed)
-----   Message from Tinnie MARLA Pro sent at 04/16/2024  3:31 PM EDT ----- Please let her know that her pap was normal and HPV negative ----- Message ----- From: Interface, [facility] Lab Results In Sent: 04/16/2024  11:35 AM EDT To: Tinnie MARLA Pro, PA-C

## 2024-04-17 NOTE — Telephone Encounter (Signed)
 Lvm for patient and sent MyChart message for patient's pap results.

## 2024-04-19 LAB — COMPREHENSIVE METABOLIC PANEL WITH GFR
ALT: 10 IU/L (ref 0–32)
AST: 10 IU/L (ref 0–40)
Albumin: 4.2 g/dL (ref 3.9–4.9)
Alkaline Phosphatase: 71 IU/L (ref 44–121)
BUN/Creatinine Ratio: 13 (ref 9–23)
BUN: 10 mg/dL (ref 6–20)
Bilirubin Total: 0.4 mg/dL (ref 0.0–1.2)
CO2: 22 mmol/L (ref 20–29)
Calcium: 9.2 mg/dL (ref 8.7–10.2)
Chloride: 104 mmol/L (ref 96–106)
Creatinine, Ser: 0.76 mg/dL (ref 0.57–1.00)
Globulin, Total: 2.3 g/dL (ref 1.5–4.5)
Glucose: 97 mg/dL (ref 70–99)
Potassium: 4.2 mmol/L (ref 3.5–5.2)
Sodium: 139 mmol/L (ref 134–144)
Total Protein: 6.5 g/dL (ref 6.0–8.5)
eGFR: 103 mL/min/1.73 (ref >59)

## 2024-04-19 LAB — B12 AND FOLATE PANEL
Folate: 6.8 ng/mL (ref >3.0)
Vitamin B-12: 381 pg/mL (ref 232–1245)

## 2024-04-19 LAB — CBC WITH DIFFERENTIAL/PLATELET
Basophils Absolute: 0 x10E3/uL (ref 0.0–0.2)
Basos: 1 %
EOS (ABSOLUTE): 0.1 x10E3/uL (ref 0.0–0.4)
Eos: 1 %
Hematocrit: 40.4 % (ref 34.0–46.6)
Hemoglobin: 13.2 g/dL (ref 11.1–15.9)
Immature Grans (Abs): 0 x10E3/uL (ref 0.0–0.1)
Immature Granulocytes: 0 %
Lymphocytes Absolute: 1.9 x10E3/uL (ref 0.7–3.1)
Lymphs: 26 %
MCH: 28.9 pg (ref 26.6–33.0)
MCHC: 32.7 g/dL (ref 31.5–35.7)
MCV: 89 fL (ref 79–97)
Monocytes Absolute: 0.5 x10E3/uL (ref 0.1–0.9)
Monocytes: 7 %
Neutrophils Absolute: 4.9 x10E3/uL (ref 1.4–7.0)
Neutrophils: 65 %
Platelets: 240 x10E3/uL (ref 150–450)
RBC: 4.56 x10E6/uL (ref 3.77–5.28)
RDW: 12.4 % (ref 11.7–15.4)
WBC: 7.5 x10E3/uL (ref 3.4–10.8)

## 2024-04-19 LAB — T4, FREE: Free T4: 1.25 ng/dL (ref 0.82–1.77)

## 2024-04-19 LAB — VITAMIN D 25 HYDROXY (VIT D DEFICIENCY, FRACTURES): Vit D, 25-Hydroxy: 29.8 ng/mL — ABNORMAL LOW (ref 30.0–100.0)

## 2024-04-19 LAB — LIPID PANEL WITH LDL/HDL RATIO
Cholesterol, Total: 143 mg/dL (ref 100–199)
HDL: 34 mg/dL — ABNORMAL LOW (ref >39)
LDL Chol Calc (NIH): 89 mg/dL (ref 0–99)
LDL/HDL Ratio: 2.6 ratio (ref 0.0–3.2)
Triglycerides: 111 mg/dL (ref 0–149)
VLDL Cholesterol Cal: 20 mg/dL (ref 5–40)

## 2024-04-19 LAB — HGB A1C W/O EAG: Hgb A1c MFr Bld: 5.3 % (ref 4.8–5.6)

## 2024-04-19 LAB — ANTINUCLEAR ANTIBODIES, IFA

## 2024-04-19 LAB — TSH: TSH: 1.85 u[IU]/mL (ref 0.450–4.500)

## 2024-04-22 ENCOUNTER — Ambulatory Visit: Payer: Self-pay | Admitting: Physician Assistant

## 2024-04-23 NOTE — Telephone Encounter (Signed)
-----   Message from Tinnie MARLA Pro sent at 04/22/2024 10:00 AM EDT ----- Please let her know that her Vit D is low--supplement OTC. B12 borderline low and could consider shots vs oral supplement. Otherwise labs look good ----- Message ----- From: Interface, [facility] Lab Results In Sent: 04/18/2024   7:37 AM EDT To: Tinnie MARLA Pro, PA-C

## 2024-04-23 NOTE — Telephone Encounter (Signed)
 LVM for patient regarding labs, I need to speak with her.

## 2024-05-02 ENCOUNTER — Telehealth: Payer: Self-pay

## 2024-05-02 NOTE — Telephone Encounter (Signed)
 Pt called that she is having GI symptoms ,abdominal pain and bloating due to is taking omeprazole  20 mg she already stopped and go back to OTC but she still having Gi symptoms pt saw online Dr through insurance they told her that she mor imaging or GI referral  as per lauren advised if worse go to ED and  also made her appt with alyssa on Monday

## 2024-05-06 ENCOUNTER — Encounter: Payer: Self-pay | Admitting: Nurse Practitioner

## 2024-05-06 ENCOUNTER — Ambulatory Visit (INDEPENDENT_AMBULATORY_CARE_PROVIDER_SITE_OTHER): Admitting: Nurse Practitioner

## 2024-05-06 VITALS — BP 122/70 | HR 90 | Temp 98.3°F | Resp 16 | Ht 61.0 in | Wt 184.6 lb

## 2024-05-06 DIAGNOSIS — R1011 Right upper quadrant pain: Secondary | ICD-10-CM

## 2024-05-06 DIAGNOSIS — K219 Gastro-esophageal reflux disease without esophagitis: Secondary | ICD-10-CM

## 2024-05-06 NOTE — Progress Notes (Signed)
 [facility] [address] [place], [place] [address]  Internal MEDICINE  Office Visit Note  Patient Name: Jamie Kennedy  [id number]  [id number]  Date of Service: 05/06/2024  Chief Complaint  Patient presents with   Acute Visit    Abdominal pain and Gi symptoms.      HPI Jamie Kennedy presents for an acute sick visit for post-prandial abdominal pain RUQ --GERD symptoms for several years -- taking omeprazole  but sometimes it does not work depending on Psychologist, counselling. Started taking omeprazole  daily instead of every other day. Then started having bloating, abdominal pain, increased gas, belching Reports having abdominal pain after every time she eats and it does not seem to matter what kind of meal      Current Medication:  Outpatient Encounter Medications as of 05/06/2024  Medication Sig   Melatonin 5 MG CAPS Take 5 mg by mouth at bedtime.   omeprazole  (PRILOSEC) 20 MG capsule Take 1 capsule (20 mg total) by mouth daily.   VITAMIN D  PO Take 1 tablet by mouth daily.   No facility-administered encounter medications on file as of 05/06/2024.      Medical History: Past Medical History:  Diagnosis Date   GAD (generalized anxiety disorder)    TMJ syndrome    Trigeminal neuralgia of right side of face      Vital Signs: BP 122/70   Pulse 90   Temp 98.3 F (36.8 C)   Resp 16   Ht 5' 1 (1.549 m)   Wt 184 lb 9.6 oz (83.7 kg)   SpO2 97%   BMI 34.88 kg/m    Review of Systems  HENT: Negative.    Respiratory: Negative.  Negative for cough, chest tightness, shortness of breath and wheezing.   Cardiovascular: Negative.  Negative for chest pain and palpitations.  Gastrointestinal:  Positive for abdominal distention, abdominal pain and nausea. Negative for constipation, diarrhea and vomiting.  Musculoskeletal: Negative.     Physical Exam Vitals reviewed.  Constitutional:      General: She is not in acute distress.    Appearance: Normal appearance. She is obese.  She is not ill-appearing.  HENT:     Head: Normocephalic and atraumatic.  Eyes:     Pupils: Pupils are equal, round, and reactive to light.  Cardiovascular:     Rate and Rhythm: Normal rate and regular rhythm.     Heart sounds: Normal heart sounds.  Pulmonary:     Effort: Pulmonary effort is normal. No respiratory distress.  Abdominal:     General: There is distension.     Palpations: Abdomen is soft. There is no mass.     Tenderness: There is no abdominal tenderness.     Hernia: No hernia is present.  Skin:    Capillary Refill: Capillary refill takes less than 2 seconds.  Neurological:     Mental Status: She is alert and oriented to person, place, and time.  Psychiatric:        Mood and Affect: Mood normal.        Behavior: Behavior normal.       Assessment/Plan: 1. Colicky RUQ abdominal pain (Primary) RUQ ultrasound ordered, pending results, will refer to general surgery. If normal ultrasound, will follow up with CT abdomen/pelvis.  - US  Abdomen Limited RUQ (LIVER/GB); Future  2. Gastroesophageal reflux disease without esophagitis Try OTC esomeprazole, lansoprazole or famotidine. If any of these medications help, let the clinic know and we can send a prescription. If none help, consider  dexlansoprazole.    General Counseling: Jamie Kennedy verbalizes understanding of the findings of todays visit and agrees with plan of treatment. I have discussed any further diagnostic evaluation that may be needed or ordered today. We also reviewed her medications today. she has been encouraged to call the office with any questions or concerns that should arise related to todays visit.    Counseling:    Orders Placed This Encounter  Procedures   US  Abdomen Limited RUQ (LIVER/GB)    No orders of the defined types were placed in this encounter.   Return if symptoms worsen or fail to improve.  [place] Controlled Substance Database was reviewed by me for overdose risk score (ORS)  Time  spent:20 Minutes Time spent with patient included reviewing progress notes, labs, imaging studies, and discussing plan for follow up.   This patient was seen by Mardy Maxin, FNP-C in collaboration with Dr. Sigrid Bathe as a part of collaborative care agreement.  Jamie Adderley R. Maxin, MSN, FNP-C Internal Medicine

## 2024-05-07 ENCOUNTER — Telehealth: Payer: Self-pay

## 2024-05-07 NOTE — Telephone Encounter (Signed)
 Pt called that she took today omeprazole  again she is having right aside of abdominal pain ,back pain and shoulder pain as per alyssa advised her to go ED

## 2024-05-09 ENCOUNTER — Telehealth: Payer: Self-pay | Admitting: Physician Assistant

## 2024-05-09 ENCOUNTER — Other Ambulatory Visit

## 2024-05-09 NOTE — Telephone Encounter (Signed)
 Lvm to schedule ED follow up-Toni

## 2024-06-05 ENCOUNTER — Encounter: Attending: Physician Assistant | Admitting: Dietician

## 2024-06-05 ENCOUNTER — Encounter: Payer: Self-pay | Admitting: Dietician

## 2024-06-05 DIAGNOSIS — K581 Irritable bowel syndrome with constipation: Secondary | ICD-10-CM

## 2024-06-05 DIAGNOSIS — G500 Trigeminal neuralgia: Secondary | ICD-10-CM

## 2024-06-05 NOTE — Progress Notes (Signed)
 Medical Nutrition Therapy: Visit start time: 1045  end time: 1150  Assessment:   Referral Diagnosis: obesity Other medical history/ diagnoses: Trigeminal neuralgia syndrome, IBS-C Psychosocial issues/ stress concerns: history of anxeity  Medications, supplements: reconciled list in medical record   Current weight: 185.7lbs Height: 5'1 BMI: 35.09  Progress and evaluation:  Patient reports making diet changes several months ago, aiming for 1350kcal daily intake, 90g protein, 20+g fiber. Used MyFitnessPal for tracking. Lost 3lb in 2 months Began experiencing abdominal pain, bloating, belching, to the point of being unable to eat  much, then stopped tracking intake. Sx started 5 days after changing omeprazole  brand/ form; improved with resumption of former brand, but not entirely gone Tough, hard foods difficult to chew due to facial neuralgia.  Waiting to hear from GI clinic to schedule appt for evaluation of symptoms  Food allergies: none known. Limits tomato products, citrus fruits, coffee due to reflux symptoms Works part time as Charity fundraiser at [employer]; has 3 children (9 - 21) in home and 1-yr old grandchild she cares for.  Patient seeks help with weight loss to improve health risk and reflux as well as positional sleep apnea    Dietary Intake:  Usual eating pattern includes 2-3 meals and 1-3 snacks per day, usually small. Dining out frequency: 2 meals per week. Who plans meals/ buys groceries? self Who prepares meals? self  Breakfast: zero sugar yogurt and sm poriton fruit and low sugar granola 1/4 c; protein drink Snack: none or same as pm Lunch: sometimes snack vs full meal; cereal with 2% Fairlife milk; pkg tuna with crackers; leftovers Snack: small portion cookies; cheddar chex mix Supper: cooks about 4x a week ie roast + potatoes, carrots, celery, wild rice; chicken pocket sandwich with salad; pizza 1x a week, other restaurant meal about 1x a week usu kids meal Snack: something sweet mini  candy 1-2pcs; occasionally popcorn Beverages: water (states not enough) usu about 30oz; stopped coffee, sodas due to GI pain; hot herbal tea 10-20oz  Physical activity: walking in neigborhood, occasional hand weights but difficult due to increase in facial pain   Intervention:   Nutrition Care Education:   Basic nutrition: appropriate nutrient balance; appropriate meal and snack schedule; general nutrition guidelines    Weight control: determining reasonable weight loss rate; importance of low sugar and low fat choices; estimated energy needs for weight loss at 1200-1300 kcal, provided guidance for 50% CHO, 20% pro, 30% fat per patient preference; role of physical activity and cardiovascular vs strength training benefits IBS: eating small, frequent meals and snacks; choosing low fat foods; controlling fiber; FODMAP diet Other:  vegetarian protein sources, reducing gas-forming from beans  Other intervention notes: Patient voices motivation to resume calorie-controlled eating pattern to promote weight loss. Established goals for weight loss and improvement of GI symptoms, with direction from patient. Patient will wait to schedule follow up visit pending determination of eligibility for [facility] employee nutrition benefits.   Nutritional Diagnosis:  [place]-1.4 Altered GI function As related to IBS.  As evidenced by patient reported abdominal pain, bloating, gas with constipation and occasional diarrhea. [place]-3.3 Overweight/obesity As related to history of excess calories, possible effects of stress .  As evidenced by current BMI of 35.   Education Materials given:  Designer, industrial/product with food lists, sample meal pattern IBS Nutrition Therapy FODMAP food chart Visit summary with goals/ instructions to be viewed via patient portal   Learner/ who was taught:  Patient   Level of understanding: Verbalizes/ demonstrates competency  Demonstrated degree of understanding via:   Teach back Learning  barriers: None  Willingness to learn/ readiness for change: Eager, change in progress   Monitoring and Evaluation:  Dietary intake, exercise, GI symptoms, and body weight      follow up: in 2 month(s), approximately, pending reimbursement coverage

## 2024-06-05 NOTE — Patient Instructions (Signed)
 Resume calorie-controlled eating pattern, following provided pattern, or tracking with goal of 1200-1300 calories.  Avoid very high fiber foods and eating much more than 20g daily, until GI symptoms resolve.  Plan to eat a small-medium meal or a snack every 3-5 hours during the day.  Work on goal for daily step count to help increase calorie-burning activity. Consider strength-building activities 1-2 times a week, exercises like lunges, squats, wall pushups, etc count.

## 2025-04-14 ENCOUNTER — Encounter: Admitting: Physician Assistant
